# Patient Record
Sex: Female | Born: 1990
Health system: Southern US, Community
[De-identification: ages and names within clinical notes are randomized; demographics above are authoritative.]

## PROBLEM LIST (undated history)

## (undated) DIAGNOSIS — T7840XA Allergy, unspecified, initial encounter: Secondary | ICD-10-CM

## (undated) DIAGNOSIS — Z8619 Personal history of other infectious and parasitic diseases: Secondary | ICD-10-CM

## (undated) DIAGNOSIS — N926 Irregular menstruation, unspecified: Secondary | ICD-10-CM

## (undated) DIAGNOSIS — J45909 Unspecified asthma, uncomplicated: Secondary | ICD-10-CM

## (undated) HISTORY — PX: NO PAST SURGERIES: SHX2092

## (undated) HISTORY — DX: Allergy, unspecified, initial encounter: T78.40XA

## (undated) HISTORY — DX: Personal history of other infectious and parasitic diseases: Z86.19

## (undated) HISTORY — DX: Irregular menstruation, unspecified: N92.6

---

## 2013-04-18 ENCOUNTER — Emergency Department (HOSPITAL_COMMUNITY)
Admission: EM | Admit: 2013-04-18 | Discharge: 2013-04-18 | Disposition: A | Discharge status: Home / Self Care | Payer: Self-pay | Attending: Emergency Medicine | Admitting: Emergency Medicine

## 2013-04-18 ENCOUNTER — Encounter (HOSPITAL_COMMUNITY): Payer: Self-pay | Admitting: Emergency Medicine

## 2013-04-18 DIAGNOSIS — N938 Other specified abnormal uterine and vaginal bleeding: Secondary | ICD-10-CM

## 2013-04-18 DIAGNOSIS — N949 Unspecified condition associated with female genital organs and menstrual cycle: Secondary | ICD-10-CM | POA: Insufficient documentation

## 2013-04-18 DIAGNOSIS — J45909 Unspecified asthma, uncomplicated: Secondary | ICD-10-CM | POA: Insufficient documentation

## 2013-04-18 DIAGNOSIS — N925 Other specified irregular menstruation: Secondary | ICD-10-CM | POA: Insufficient documentation

## 2013-04-18 DIAGNOSIS — Z3202 Encounter for pregnancy test, result negative: Secondary | ICD-10-CM | POA: Insufficient documentation

## 2013-04-18 HISTORY — DX: Unspecified asthma, uncomplicated: J45.909

## 2013-04-18 LAB — URINE MICROSCOPIC-ADD ON

## 2013-04-18 LAB — PREGNANCY, URINE: Preg Test, Ur: NEGATIVE

## 2013-04-18 LAB — WET PREP, GENITAL
Clue Cells Wet Prep HPF POC: NONE SEEN
Trich, Wet Prep: NONE SEEN
Yeast Wet Prep HPF POC: NONE SEEN

## 2013-04-18 LAB — URINALYSIS, ROUTINE W REFLEX MICROSCOPIC
Bilirubin Urine: NEGATIVE
Glucose, UA: NEGATIVE mg/dL
Protein, ur: 30 mg/dL — AB
Urobilinogen, UA: 1 mg/dL (ref 0.0–1.0)

## 2013-04-18 MED ORDER — HYDROMORPHONE HCL PF 1 MG/ML IJ SOLN
1.0000 mg | Freq: Once | INTRAMUSCULAR | Status: DC
  Filled 2013-04-18: qty 1

## 2013-04-18 NOTE — ED Notes (Signed)
Patient reports she has been on her period since Oct 25th sometimes with light bleeding and sometimes heavy with clots.  Also she is reporting left sided lower abdominal pain and back pain and burning with urination.  Reports slight amount of nausea but no vomiting.

## 2013-04-18 NOTE — ED Provider Notes (Signed)
CSN: [id number]     Arrival date & time 04/18/13  1601 History   First MD Initiated Contact with Patient 04/18/13 1609     Chief Complaint  Patient presents with  . Abdominal Pain   (Consider location/radiation/quality/duration/timing/severity/associated sxs/prior Treatment) HPI Pt presenting with concern for vaginal bleeding and abdominal pain.  Pt states she has been having bleeding over the past 2 months-heavier at the time of her normal menses, then spotting in between.  This morning she felt burning with urination.  No fever.  No increased frequency or urgency.  No weakness, dizziness upon standing or fainting.  Has seen some clots of blood earlier in the course but not currently.  Pt states she has never been sexually active. Abdominal pain is in left mid abdomen- this started earlier today.  She has not tried anything for her symptoms.  There are no other associated systemic symptoms, there are no other alleviating or modifying factors.   Past Medical History  Diagnosis Date  . Asthma    History reviewed. No pertinent past surgical history. No family history on file. History  Substance Use Topics  . Smoking status: Never Smoker   . Smokeless tobacco: Not on file  . Alcohol Use: Not on file   OB History   Grav Para Term Preterm Abortions TAB SAB Ect Mult Living                 Review of Systems ROS reviewed and all otherwise negative except for mentioned in HPI  Allergies  Review of patient's allergies indicates no known allergies.  Home Medications   Current Outpatient Rx  Name  Route  Sig  Dispense  Refill  . Ibuprofen (MIDOL) 200 MG CAPS   Oral   Take 2 capsules by mouth as needed (menstrual cramps.).         Marland Kitchen OVER THE COUNTER MEDICATION   Oral   Take 2 capsules by mouth 2 (two) times daily. Lipozene dietary supplement.          BP 121/56  Pulse 81  Temp(Src) 98.3 F (36.8 C) (Oral)  Resp 20  SpO2 100% Vitals reviewed Physical Exam Physical  Examination: General appearance - alert, well appearing, and in no distress Mental status - alert, oriented to person, place, and time Eyes - no scleral icterus, no conjunctival injection Chest - clear to auscultation, no wheezes, rales or rhonchi, symmetric air entry Heart - normal rate, regular rhythm, normal S1, S2, no murmurs, rubs, clicks or gallops Abdomen - soft, nontender, nondistended, no masses or organomegaly Pelvic - normal external genitalia, vulva, vagina, not able to tolerate speculum exam, mild amount of vaginal bleeding present, no adnexal or uterine tenderness, no CMT Extremities - peripheral pulses normal, no pedal edema, no clubbing or cyanosis Skin - normal coloration and turgor, no rashes  ED Course  Procedures (including critical care time) Labs Review Labs Reviewed  WET PREP, GENITAL - Abnormal; Notable for the following:    WBC, Wet Prep HPF POC RARE (*)    All other components within normal limits  URINALYSIS, ROUTINE W REFLEX MICROSCOPIC - Abnormal; Notable for the following:    Color, Urine RED (*)    APPearance CLOUDY (*)    Hgb urine dipstick LARGE (*)    Protein, ur 30 (*)    Leukocytes, UA MODERATE (*)    All other components within normal limits  URINE MICROSCOPIC-ADD ON - Abnormal; Notable for the following:    Bacteria, UA FEW (*)  All other components within normal limits  GC/CHLAMYDIA PROBE AMP  URINE CULTURE  PREGNANCY, URINE   Imaging Review No results found.  EKG Interpretation   None       MDM   1. Dysfunctional uterine bleeding    Pt presenting with c/o vaginal bleeding over past 2 months.  Pt is not tachycardic, no symptoms of anemia.  Abdomen is midly tender but to the left of the umbilicus- no tenderness on pelvic exam.  Doubt serious intraabdominal pathology.  Urine is not c/w UTI- urine culture sent.  D/w patietn f/u with gynecology.  Discharged with strict return precautions.  Pt agreeable with plan.    Ethelda Chick,  MD 04/18/13 1900

## 2013-04-18 NOTE — ED Notes (Signed)
Patient refused pain medication, saying she is not in pain at this time.

## 2013-04-18 NOTE — ED Notes (Signed)
Patient in bathroom getting urine sample 

## 2013-04-20 LAB — URINE CULTURE: Colony Count: 40000

## 2013-04-21 LAB — GC/CHLAMYDIA PROBE AMP: GC Probe RNA: NEGATIVE

## 2013-04-24 NOTE — ED Notes (Signed)
Chart returned from EDP office .With orders written by Johnnette Gourd for  Azithromycin 1 gram po x 1 dose

## 2013-04-25 ENCOUNTER — Telehealth (HOSPITAL_COMMUNITY): Payer: Self-pay | Admitting: *Deleted

## 2013-04-25 NOTE — ED Notes (Signed)
Rx for Azithromycin 1 gram po x 1 dose needs to be called to pharmacy. Per Johnnette Gourd

## 2013-11-28 ENCOUNTER — Encounter (HOSPITAL_COMMUNITY): Payer: Self-pay | Admitting: *Deleted

## 2013-11-28 ENCOUNTER — Inpatient Hospital Stay (HOSPITAL_COMMUNITY)
Admission: AD | Admit: 2013-11-28 | Discharge: 2013-11-28 | Disposition: A | Discharge status: Home / Self Care | Payer: BC Managed Care – PPO | Source: Ambulatory Visit | Attending: Obstetrics & Gynecology | Admitting: Obstetrics & Gynecology

## 2013-11-28 DIAGNOSIS — G8929 Other chronic pain: Secondary | ICD-10-CM

## 2013-11-28 DIAGNOSIS — N3090 Cystitis, unspecified without hematuria: Secondary | ICD-10-CM | POA: Insufficient documentation

## 2013-11-28 DIAGNOSIS — N3000 Acute cystitis without hematuria: Secondary | ICD-10-CM

## 2013-11-28 DIAGNOSIS — M549 Dorsalgia, unspecified: Secondary | ICD-10-CM | POA: Insufficient documentation

## 2013-11-28 LAB — URINE MICROSCOPIC-ADD ON

## 2013-11-28 LAB — URINALYSIS, ROUTINE W REFLEX MICROSCOPIC
Bilirubin Urine: NEGATIVE
GLUCOSE, UA: NEGATIVE mg/dL
KETONES UR: NEGATIVE mg/dL
Nitrite: NEGATIVE
Protein, ur: 30 mg/dL — AB
Specific Gravity, Urine: 1.025 (ref 1.005–1.030)
Urobilinogen, UA: 0.2 mg/dL (ref 0.0–1.0)
pH: 5.5 (ref 5.0–8.0)

## 2013-11-28 LAB — WET PREP, GENITAL
Clue Cells Wet Prep HPF POC: NONE SEEN
TRICH WET PREP: NONE SEEN
YEAST WET PREP: NONE SEEN

## 2013-11-28 LAB — POCT PREGNANCY, URINE: PREG TEST UR: NEGATIVE

## 2013-11-28 MED ORDER — PHENAZOPYRIDINE HCL 200 MG PO TABS: 200.0000 mg | ORAL_TABLET | Freq: Three times a day (TID) | ORAL | Status: DC

## 2013-11-28 MED ORDER — CIPROFLOXACIN HCL 250 MG PO TABS: 250.0000 mg | ORAL_TABLET | Freq: Two times a day (BID) | ORAL | Status: DC

## 2013-11-28 NOTE — MAU Provider Note (Signed)
History     CSN: [id number]  Arrival date and time: 11/28/13 1444   First Provider Initiated Contact with Patient 11/28/13 1533      Chief Complaint  Patient presents with  . Back Pain  . Dysuria   The history is provided by the patient.   Ms. Brooke Russell is a 23 y/o G0P0 with a PMH of recurrent UTI's and + chlamydia screen who presents with moderate chronic low back pain off and on as well as frequency, urgency, hesitancy and burning with urination. LMP 11/26/13.   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 Initially the pain resolved with acetaminophen but retuned with increased intensity two days later and is now constant and sharp to achy. Pain is decreased with standing and lying prone with a pillow under her belly. Pain is also decreased by pulling her knees towards her chest while seated.  Pain increases with lying flat, forceful low back extension and walking short distances. Patient indicates that low back pain is diffuse and occurs on either side of her spine and is not associated with radiating pain, bowel or bladder dysfunction, and no tingling, numbness, weakness in the distal extremities.   Urinary symptoms began this morning and pain only happens during urination. Patient denies associated symptoms of fever, chills, N/V, discharge or blood in urine. Patient denies any Hx of vaginal intercourse and is not currently sexually active.   OB History   Grav Para Term Preterm Abortions TAB SAB Ect Mult Living   0               Past Medical History  Diagnosis Date  . Asthma     Past Surgical History  Procedure Laterality Date  . No past surgeries      No family history on file.  History  Substance Use Topics  . Smoking status: Never Smoker   . Smokeless tobacco: Not on file  . Alcohol Use: Yes     Comment: very rare    Allergies: No Known Allergies  Prescriptions prior to admission  Medication Sig Dispense  Refill  . albuterol (PROVENTIL HFA;VENTOLIN HFA) 108 (90 BASE) MCG/ACT inhaler Inhale 2 puffs into the lungs every 6 (six) hours as needed for wheezing or shortness of breath.      . Cyanocobalamin (B-12 PO) Take 1 tablet by mouth daily.      . Fluocinolone Acetonide (DERMA-SMOOTHE/FS BODY) 0.01 % OIL Apply 1 application topically daily.      Marland Kitchen. OVER THE COUNTER MEDICATION Take 2 capsules by mouth daily. Lipozene dietary supplement.        Review of Systems  Constitutional: Negative for fever and malaise/fatigue.  Gastrointestinal: Negative for nausea, vomiting, abdominal pain, diarrhea and constipation.  Genitourinary: Positive for dysuria, urgency and frequency. Negative for hematuria and flank pain.       Neg - vaginal bleeding, discharge  Musculoskeletal: Positive for back pain.   Physical Exam   Blood pressure 123/66, pulse 91, temperature 98.9 F (37.2 C), temperature source Oral, resp. rate 18, height 5\' 5"  (1.651 m), weight 356 lb (161.481 kg), last menstrual period 11/25/2013.  Physical Exam  Constitutional: She appears well-developed and well-nourished.  BMI 59.2  HENT:  Head: Normocephalic and atraumatic.  Eyes: Conjunctivae are normal. Right eye exhibits no discharge. Left eye exhibits no discharge.  Cardiovascular: Normal rate and regular rhythm.   Murmur heard. Grade I-II/VI systolic ejection murmur heard best over  L upper sternal border 2nd intercostal space  Respiratory: Effort normal and breath sounds normal.  GI: Soft. Bowel sounds are normal. She exhibits no distension and no mass. There is no tenderness. There is no guarding.  Genitourinary: No vaginal discharge found.  Speculum Exam Deferred. Wet prep and GC/chalmydia obtained with blind swab. Patient declined speculum exam. States that she is a virgin  Musculoskeletal:  Negative straight leg test R and L. Pain decreased with hips and knees flexed to chest bilaterally. Patient acutely tender to palpation over L  and R sacroiliac jts. And lumbar paraspinals.  Neurological: She is alert.  L1 - S1 dermatomes equal and intact bilaterally. L1 - S1 myotomes 5/5 equal and intact bilaterally.   Skin: Skin is warm and dry. No erythema.  Psychiatric: She has a normal mood and affect. Her behavior is normal.   Results for orders placed during the hospital encounter of 11/28/13 (from the past 24 hour(s))  URINALYSIS, ROUTINE W REFLEX MICROSCOPIC     Status: Abnormal   Collection Time    11/28/13  3:10 PM      Result Value Ref Range   Color, Urine YELLOW  YELLOW   APPearance HAZY (*) CLEAR   Specific Gravity, Urine 1.025  1.005 - 1.030   pH 5.5  5.0 - 8.0   Glucose, UA NEGATIVE  NEGATIVE mg/dL   Hgb urine dipstick LARGE (*) NEGATIVE   Bilirubin Urine NEGATIVE  NEGATIVE   Ketones, ur NEGATIVE  NEGATIVE mg/dL   Protein, ur 30 (*) NEGATIVE mg/dL   Urobilinogen, UA 0.2  0.0 - 1.0 mg/dL   Nitrite NEGATIVE  NEGATIVE   Leukocytes, UA TRACE (*) NEGATIVE  URINE MICROSCOPIC-ADD ON     Status: Abnormal   Collection Time    11/28/13  3:10 PM      Result Value Ref Range   Squamous Epithelial / LPF MANY (*) RARE   WBC, UA 3-6  <3 WBC/hpf   RBC / HPF TOO NUMEROUS TO COUNT  <3 RBC/hpf  POCT PREGNANCY, URINE     Status: None   Collection Time    11/28/13  3:16 PM      Result Value Ref Range   Preg Test, Ur NEGATIVE  NEGATIVE  WET PREP, GENITAL     Status: Abnormal   Collection Time    11/28/13  4:36 PM      Result Value Ref Range   Yeast Wet Prep HPF POC NONE SEEN  NONE SEEN   Trich, Wet Prep NONE SEEN  NONE SEEN   Clue Cells Wet Prep HPF POC NONE SEEN  NONE SEEN   WBC, Wet Prep HPF POC FEW (*) NONE SEEN     MAU Course  Procedures None  MDM UPT - negative UA, wet prep and GC/Chlamydia today Urine culture ordered  Assessment and Plan  A: Cystitis secondary to UTI, UA contaminated by onset of menses but with 3-6 Leukocytes per/HPF.      Lumbar paraspinal strain.   P: UTI symptoms treated  presumptively with cipro and pyridium. Urine culture ordered. Follow up with PCP or orthopedics for treatment of low back pain. Discussed use of NSAIDS vs acetaminophen for treatment of symptoms.  Vara Guardian PA-S   Urine culture pending GC/Chlamydia pending  I have seen and evaluated the patient with the NP/PA/Med student. I agree with the assessment and plan as written above.   Freddi Starr, PA-C  11/28/2013 6:05 PM

## 2013-11-28 NOTE — MAU Provider Note (Signed)
Attestation of Attending Supervision of Advanced Practitioner (PA/CNM/NP): Evaluation and management procedures were performed by the Advanced Practitioner under my supervision and collaboration.  I have reviewed the Advanced Practitioner's note and chart, and I agree with the management and plan.  Brenda Samano, MD, FACOG Attending Obstetrician & Gynecologist Faculty Practice, [facility]   

## 2013-11-28 NOTE — Discharge Instructions (Signed)

## 2013-11-28 NOTE — MAU Note (Addendum)
Low back pain past 3 days, hx of bad UTI's.  Having burning with urination, started today. Periods have been irreg in frequency and duration.  Neg cva tenderness

## 2013-11-29 LAB — URINE CULTURE: Colony Count: 25000

## 2013-11-29 LAB — GC/CHLAMYDIA PROBE AMP
CT PROBE, AMP APTIMA: NEGATIVE
GC Probe RNA: NEGATIVE

## 2013-12-09 ENCOUNTER — Inpatient Hospital Stay (HOSPITAL_COMMUNITY)
Admission: AD | Admit: 2013-12-09 | Discharge: 2013-12-09 | Disposition: A | Discharge status: Home / Self Care | Payer: BC Managed Care – PPO | Source: Ambulatory Visit | Attending: Obstetrics & Gynecology | Admitting: Obstetrics & Gynecology

## 2013-12-09 ENCOUNTER — Encounter (HOSPITAL_COMMUNITY): Payer: Self-pay | Admitting: *Deleted

## 2013-12-09 DIAGNOSIS — N938 Other specified abnormal uterine and vaginal bleeding: Secondary | ICD-10-CM | POA: Insufficient documentation

## 2013-12-09 DIAGNOSIS — N926 Irregular menstruation, unspecified: Secondary | ICD-10-CM

## 2013-12-09 DIAGNOSIS — N949 Unspecified condition associated with female genital organs and menstrual cycle: Secondary | ICD-10-CM | POA: Insufficient documentation

## 2013-12-09 DIAGNOSIS — N939 Abnormal uterine and vaginal bleeding, unspecified: Secondary | ICD-10-CM

## 2013-12-09 DIAGNOSIS — N925 Other specified irregular menstruation: Secondary | ICD-10-CM | POA: Insufficient documentation

## 2013-12-09 LAB — CBC WITH DIFFERENTIAL/PLATELET
BASOS PCT: 0 % (ref 0–1)
Basophils Absolute: 0 10*3/uL (ref 0.0–0.1)
EOS ABS: 0.2 10*3/uL (ref 0.0–0.7)
Eosinophils Relative: 3 % (ref 0–5)
HCT: 33 % — ABNORMAL LOW (ref 36.0–46.0)
Hemoglobin: 10.6 g/dL — ABNORMAL LOW (ref 12.0–15.0)
Lymphocytes Relative: 39 % (ref 12–46)
Lymphs Abs: 2.9 10*3/uL (ref 0.7–4.0)
MCH: 26.7 pg (ref 26.0–34.0)
MCHC: 32.1 g/dL (ref 30.0–36.0)
MCV: 83.1 fL (ref 78.0–100.0)
Monocytes Absolute: 0.5 10*3/uL (ref 0.1–1.0)
Monocytes Relative: 7 % (ref 3–12)
NEUTROS PCT: 51 % (ref 43–77)
Neutro Abs: 3.9 10*3/uL (ref 1.7–7.7)
PLATELETS: 278 10*3/uL (ref 150–400)
RBC: 3.97 MIL/uL (ref 3.87–5.11)
RDW: 14.5 % (ref 11.5–15.5)
WBC: 7.5 10*3/uL (ref 4.0–10.5)

## 2013-12-09 MED ORDER — NORGESTIMATE-ETH ESTRADIOL 0.25-35 MG-MCG PO TABS: ORAL_TABLET | ORAL | Status: DC

## 2013-12-09 NOTE — MAU Provider Note (Signed)
  History     CSN: [id number]  Arrival date and time: 12/09/13 1601   First Provider Initiated Contact with Patient 12/09/13 1654      Chief Complaint  Patient presents with  . Vaginal Bleeding   HPIpt is not pregnant and presents with prolonged vaginal bleeding since 11/26/2013.  Pt started bleeding Bright red changing a pad every 2 hours.  Pt was seen on 11/28/2013 with UTI symptoms and treated with Cipro and Pyridium. Pt's sx have resolved from that but pt has continued to bleed.  Pt is morbidly obese and has gained weight over past 8 months. Menarche age? 13 with RCM until this episode.  Pt is not sexually active.  Registered Nurse Signed MAU Note Service date: 12/09/2013 4:21 PM   T. States that she has been bleeding since the 10th which was her period. Pt. States that menstrual period lasts about 4 days.       Past Medical History  Diagnosis Date  . Asthma     Past Surgical History  Procedure Laterality Date  . No past surgeries      History reviewed. No pertinent family history.  History  Substance Use Topics  . Smoking status: Never Smoker   . Smokeless tobacco: Never Used  . Alcohol Use: No     Comment: very rare    Allergies: No Known Allergies  Prescriptions prior to admission  Medication Sig Dispense Refill  . Cyanocobalamin (B-12 PO) Take 5,000 mg by mouth daily.       . Fluocinolone Acetonide (DERMA-SMOOTHE/FS BODY) 0.01 % OIL Apply 1 application topically daily.      Marland Kitchen. albuterol (PROVENTIL HFA;VENTOLIN HFA) 108 (90 BASE) MCG/ACT inhaler Inhale 2 puffs into the lungs every 6 (six) hours as needed for wheezing or shortness of breath.        Review of Systems  Constitutional: Negative for fever and chills.  Gastrointestinal: Negative for nausea, vomiting, abdominal pain, diarrhea and constipation.  Genitourinary: Negative for dysuria and urgency.   Physical Exam   Blood pressure 133/73, pulse 87, temperature 98.6 F (37 C), temperature source  Oral, resp. rate 20, last menstrual period 11/25/2013.  Physical Exam  Nursing note and vitals reviewed. Constitutional: She is oriented to person, place, and time. She appears well-developed and well-nourished. No distress.  HENT:  Head: Normocephalic.  Eyes: Pupils are equal, round, and reactive to light.  Neck: Normal range of motion. Neck supple.  Cardiovascular: Normal rate.   Respiratory: Effort normal.  GI: Soft. She exhibits no distension. There is no tenderness. There is no rebound.  Genitourinary:  Deferred- not sexually active  Musculoskeletal: Normal range of motion.  Neurological: She is alert and oriented to person, place, and time.  Skin: Skin is warm and dry.  Psychiatric: She has a normal mood and affect.    MAU Course  Procedures Discussed at length about prolonged bleeding and also possible PCOS Option of ultrasound discussed- will defer and have clinic follow up Will prescribe OCs 2 daily for 5 days and then one daily until appointment    Assessment and Plan  Abnormal uterine bleeding Rx Sprintec 21 day pack 2 tablets daily for 5 days and then one daily until appointment [facility]  [facility],Demontray Franta 12/09/2013, 4:56 PM

## 2013-12-09 NOTE — MAU Note (Signed)
T. States that she has been bleeding since the 10th which was her period. Pt. States that menstrual period lasts about 4 days.

## 2013-12-09 NOTE — Discharge Instructions (Signed)
Abnormal Uterine Bleeding Abnormal uterine bleeding can affect women at various stages in life, including teenagers, women in their reproductive years, pregnant women, and women who have reached menopause. Several kinds of uterine bleeding are considered abnormal, including:  Bleeding or spotting between periods.   Bleeding after sexual intercourse.   Bleeding that is heavier or more than normal.   Periods that last longer than usual.  Bleeding after menopause.  Many cases of abnormal uterine bleeding are minor and simple to treat, while others are more serious. Any type of abnormal bleeding should be evaluated by your health care provider. Treatment will depend on the cause of the bleeding. HOME CARE INSTRUCTIONS Monitor your condition for any changes. The following actions may help to alleviate any discomfort you are experiencing:  Avoid the use of tampons and douches as directed by your health care provider.  Change your pads frequently. You should get regular pelvic exams and Pap tests. Keep all follow-up appointments for diagnostic tests as directed by your health care provider.  SEEK MEDICAL CARE IF:   Your bleeding lasts more than 1 week.   You feel dizzy at times.  SEEK IMMEDIATE MEDICAL CARE IF:   You pass out.   You are changing pads every 15 to 30 minutes.   You have abdominal pain.  You have a fever.   You become sweaty or weak.   You are passing large blood clots from the vagina.   You start to feel nauseous and vomit. MAKE SURE YOU:   Understand these instructions.  Will watch your condition.  Will get help right away if you are not doing well or get worse. Document Released: 05/08/2005 Document Revised: 05/13/2013 Document Reviewed: 12/05/2012 Exit[employer] Patient Information 2015 [employer]. This information is not intended to replace advice given to you by your health care provider. Make sure you discuss any questions you have with your  health care provider.  

## 2013-12-09 NOTE — MAU Note (Signed)
Still bleeding. Really heavy.

## 2013-12-10 NOTE — MAU Provider Note (Signed)
Attestation of Attending Supervision of Advanced Practitioner (PA/CNM/NP): Evaluation and management procedures were performed by the Advanced Practitioner under my supervision and collaboration.  I have reviewed the Advanced Practitioner's note and chart, and I agree with the management and plan.  Maureen Delatte, MD, FACOG Attending Obstetrician & Gynecologist Faculty Practice, [facility]   

## 2014-12-23 ENCOUNTER — Encounter (HOSPITAL_COMMUNITY): Payer: Self-pay | Admitting: *Deleted

## 2014-12-23 ENCOUNTER — Inpatient Hospital Stay (HOSPITAL_COMMUNITY)
Admission: AD | Admit: 2014-12-23 | Discharge: 2014-12-23 | Disposition: A | Discharge status: Home / Self Care | Payer: Self-pay | Source: Ambulatory Visit | Attending: Obstetrics and Gynecology | Admitting: Obstetrics and Gynecology

## 2014-12-23 DIAGNOSIS — N898 Other specified noninflammatory disorders of vagina: Secondary | ICD-10-CM | POA: Insufficient documentation

## 2014-12-23 DIAGNOSIS — R300 Dysuria: Secondary | ICD-10-CM | POA: Insufficient documentation

## 2014-12-23 LAB — URINALYSIS, ROUTINE W REFLEX MICROSCOPIC
BILIRUBIN URINE: NEGATIVE
Glucose, UA: NEGATIVE mg/dL
Hgb urine dipstick: NEGATIVE
Ketones, ur: NEGATIVE mg/dL
Nitrite: NEGATIVE
Protein, ur: NEGATIVE mg/dL
Specific Gravity, Urine: 1.025 (ref 1.005–1.030)
Urobilinogen, UA: 0.2 mg/dL (ref 0.0–1.0)
pH: 5.5 (ref 5.0–8.0)

## 2014-12-23 LAB — WET PREP, GENITAL
Clue Cells Wet Prep HPF POC: NONE SEEN
TRICH WET PREP: NONE SEEN
Yeast Wet Prep HPF POC: NONE SEEN

## 2014-12-23 LAB — URINE MICROSCOPIC-ADD ON

## 2014-12-23 LAB — POCT PREGNANCY, URINE: PREG TEST UR: NEGATIVE

## 2014-12-23 MED ORDER — TERCONAZOLE 0.8 % VA CREA: 1.0000 | TOPICAL_CREAM | Freq: Every day | VAGINAL | Status: DC

## 2014-12-23 NOTE — Discharge Instructions (Signed)
Dysuria °Dysuria is the medical term for pain with urination. There are many causes for dysuria, but urinary tract infection is the most common. If a urinalysis was performed it can show that there is a urinary tract infection. A urine culture confirms that you or your child is sick. You will need to follow up with a healthcare provider because: °· If a urine culture was done you will need to know the culture results and treatment recommendations. °· If the urine culture was positive, you or your child will need to be put on antibiotics or know if the antibiotics prescribed are the right antibiotics for your urinary tract infection. °· If the urine culture is negative (no urinary tract infection), then other causes may need to be explored or antibiotics need to be stopped. °Today laboratory work may have been done and there does not seem to be an infection. If cultures were done they will take at least 24 to 48 hours to be completed. °Today x-rays may have been taken and they read as normal. No cause can be found for the problems. The x-rays may be re-read by a radiologist and you will be contacted if additional findings are made. °You or your child may have been put on medications to help with this problem until you can see your primary caregiver. If the problems get better, see your primary caregiver if the problems return. If you were given antibiotics (medications which kill germs), take all of the mediations as directed for the full course of treatment.  °If laboratory work was done, you need to find the results. Leave a telephone number where you can be reached. If this is not possible, make sure you find out how you are to get test results. °HOME CARE INSTRUCTIONS  °· Drink lots of fluids. For adults, drink eight, 8 ounce glasses of clear juice or water a day. For children, replace fluids as suggested by your caregiver. °· Empty the bladder often. Avoid holding urine for long periods of time. °· After a bowel  movement, women should cleanse front to back, using each tissue only once. °· Empty your bladder before and after sexual intercourse. °· Take all the medicine given to you until it is gone. You may feel better in a few days, but TAKE ALL MEDICINE. °· Avoid caffeine, tea, alcohol and carbonated beverages, because they tend to irritate the bladder. °· In men, alcohol may irritate the prostate. °· Only take over-the-counter or prescription medicines for pain, discomfort, or fever as directed by your caregiver. °· If your caregiver has given you a follow-up appointment, it is very important to keep that appointment. Not keeping the appointment could result in a chronic or permanent injury, pain, and disability. If there is any problem keeping the appointment, you must call back to this facility for assistance. °SEEK IMMEDIATE MEDICAL CARE IF:  °· Back pain develops. °· A fever develops. °· There is nausea (feeling sick to your stomach) or vomiting (throwing up). °· Problems are no better with medications or are getting worse. °MAKE SURE YOU:  °· Understand these instructions. °· Will watch your condition. °· Will get help right away if you are not doing well or get worse. °Document Released: 02/04/2004 Document Revised: 07/31/2011 Document Reviewed: 12/12/2007 °ExitCare® Patient Information ©2015 [employer]. This information is not intended to replace advice given to you by your health care provider. Make sure you discuss any questions you have with your health care provider. ° ° °Candida Infection °  A Candida infection (also called yeast, fungus, and Monilia infection) is an overgrowth of yeast that can occur anywhere on the body. A yeast infection commonly occurs in warm, moist body areas. Usually, the infection remains localized but can spread to become a systemic infection. A yeast infection may be a sign of a more severe disease such as diabetes, leukemia, or AIDS. °A yeast infection can occur in both men and  women. In women, Candida vaginitis is a vaginal infection. It is one of the most common causes of vaginitis. Men usually do not have symptoms or know they have an infection until other problems develop. Men may find out they have a yeast infection because their sex partner has a yeast infection. Uncircumcised men are more likely to get a yeast infection than circumcised men. This is because the uncircumcised glans is not exposed to air and does not remain as dry as that of a circumcised glans. Older adults may develop yeast infections around dentures. °CAUSES  °Women °· Antibiotics. °· Steroid medication taken for a long time. °· Being overweight (obese). °· Diabetes. °· Poor immune condition. °· Certain serious medical conditions. °· Immune suppressive medications for organ transplant patients. °· Chemotherapy. °· Pregnancy. °· Menstruation. °· Stress and fatigue. °· Intravenous drug use. °· Oral contraceptives. °· Wearing tight-fitting clothes in the crotch area. °· Catching it from a sex partner who has a yeast infection. °· Spermicide. °· Intravenous, urinary, or other catheters. °Men °· Catching it from a sex partner who has a yeast infection. °· Having oral or anal sex with a person who has the infection. °· Spermicide. °· Diabetes. °· Antibiotics. °· Poor immune system. °· Medications that suppress the immune system. °· Intravenous drug use. °· Intravenous, urinary, or other catheters. °SYMPTOMS  °Women °· Thick, white vaginal discharge. °· Vaginal itching. °· Redness and swelling in and around the vagina. °· Irritation of the lips of the vagina and perineum. °· Blisters on the vaginal lips and perineum. °· Painful sexual intercourse. °· Low blood sugar (hypoglycemia). °· Painful urination. °· Bladder infections. °· Intestinal problems such as constipation, indigestion, bad breath, bloating, increase in gas, diarrhea, or loose stools. °Men °· Men may develop intestinal problems such as constipation,  indigestion, bad breath, bloating, increase in gas, diarrhea, or loose stools. °· Dry, cracked skin on the penis with itching or discomfort. °· Jock itch. °· Dry, flaky skin. °· Athlete's foot. °· Hypoglycemia. °DIAGNOSIS  °Women °· A history and an exam are performed. °· The discharge may be examined under a microscope. °· A culture may be taken of the discharge. °Men °· A history and an exam are performed. °· Any discharge from the penis or areas of cracked skin will be looked at under the microscope and cultured. °· Stool samples may be cultured. °TREATMENT  °Women °· Vaginal antifungal suppositories and creams. °· Medicated creams to decrease irritation and itching on the outside of the vagina. °· Warm compresses to the perineal area to decrease swelling and discomfort. °· Oral antifungal medications. °· Medicated vaginal suppositories or cream for repeated or recurrent infections. °· Wash and dry the irritation areas before applying the cream. °· Eating yogurt with Lactobacillus may help with prevention and treatment. °· Sometimes painting the vagina with gentian violet solution may help if creams and suppositories do not work. °Men °· Antifungal creams and oral antifungal medications. °· Sometimes treatment must continue for 30 days after the symptoms go away to prevent recurrence. °HOME CARE INSTRUCTIONS  °  Women °· Use cotton underwear and avoid tight-fitting clothing. °· Avoid colored, scented toilet paper and deodorant tampons or pads. °· Do not douche. °· Keep your diabetes under control. °· Finish all the prescribed medications. °· Keep your skin clean and dry. °· Consume milk or yogurt with Lactobacillus-active culture regularly. If you get frequent yeast infections and think that is what the infection is, there are over-the-counter medications that you can get. If the infection does not show healing in 3 days, talk to your caregiver. °· Tell your sex partner you have a yeast infection. Your partner may  need treatment also, especially if your infection does not clear up or recurs. °Men °· Keep your skin clean and dry. °· Keep your diabetes under control. °· Finish all prescribed medications. °· Tell your sex partner that you have a yeast infection so he or she can be treated if necessary. °SEEK MEDICAL CARE IF:  °· Your symptoms do not clear up or worsen in one week after treatment. °· You have an oral temperature above 102° F (38.9° C). °· You have trouble swallowing or eating for a prolonged time. °· You develop blisters on and around your vagina. °· You develop vaginal bleeding and it is not your menstrual period. °· You develop abdominal pain. °· You develop intestinal problems as mentioned above. °· You get weak or light-headed. °· You have painful or increased urination. °· You have pain during sexual intercourse. °MAKE SURE YOU:  °· Understand these instructions. °· Will watch your condition. °· Will get help right away if you are not doing well or get worse. °Document Released: 06/15/2004 Document Revised: 09/22/2013 Document Reviewed: 09/27/2009 °ExitCare® Patient Information ©2015 [employer]. This information is not intended to replace advice given to you by your health care provider. Make sure you discuss any questions you have with your health care provider. ° °

## 2014-12-23 NOTE — MAU Note (Signed)
Desires pregnancy test; desires STD check; c/o lower back pain and burning with urination; uses BC pill;

## 2014-12-23 NOTE — MAU Provider Note (Signed)
History     CSN: [id number]  Arrival date and time: 12/23/14 1721   None     Chief Complaint  Patient presents with  . Dysuria   HPI Brooke Russell is 24 y.o. G0P0 Unknown weeks presenting with frequency, dysuria since 2 days with low back pain.  Would like pregnancy test and STD screening tonight.  Is using OCPs for contraception. 1 Sexually partner X 6 years,  Engaged.  Last intercourse a "long time ago" b/c her boyfriend is deployed and will be home end of the week.  Would like sxs to be resolved.    Past Medical History  Diagnosis Date  . Asthma     Past Surgical History  Procedure Laterality Date  . No past surgeries      Family History  Problem Relation Age of Onset  . Alcohol abuse Neg Hx   . Arthritis Neg Hx   . Asthma Neg Hx   . Birth defects Neg Hx   . Cancer Neg Hx   . COPD Neg Hx   . Depression Neg Hx   . Diabetes Neg Hx   . Drug abuse Neg Hx   . Early death Neg Hx   . Hearing loss Neg Hx   . Heart disease Neg Hx   . Hyperlipidemia Neg Hx   . Hypertension Neg Hx   . Kidney disease Neg Hx   . Learning disabilities Neg Hx   . Mental illness Neg Hx   . Mental retardation Neg Hx   . Miscarriages / Stillbirths Neg Hx   . Stroke Neg Hx   . Vision loss Neg Hx   . Varicose Veins Neg Hx     History  Substance Use Topics  . Smoking status: Never Smoker   . Smokeless tobacco: Never Used  . Alcohol Use: No     Comment: very rare    Allergies: No Known Allergies  Prescriptions prior to admission  Medication Sig Dispense Refill Last Dose  . albuterol (PROVENTIL HFA;VENTOLIN HFA) 108 (90 BASE) MCG/ACT inhaler Inhale 2 puffs into the lungs every 6 (six) hours as needed for wheezing or shortness of breath.   rescue  . Fluocinolone Acetonide (DERMA-SMOOTHE/FS BODY) 0.01 % OIL Apply 1 application topically daily.   12/09/2013 at Unknown time  . norgestimate-ethinyl estradiol (ORTHO-CYCLEN,SPRINTEC,PREVIFEM) 0.25-35 MG-MCG tablet 2 tablets daily for 5 days then  1 daily until appointment 1 Package 11     Review of Systems  Constitutional: Negative for fever and chills.  Gastrointestinal: Negative for nausea, vomiting and abdominal pain.  Genitourinary: Positive for dysuria, urgency and frequency.       Neg for vaginal bleeding or abnormal discharge.   Musculoskeletal: Positive for back pain (low back pain).  Neurological: Negative for headaches.   Physical Exam   Blood pressure 126/72, pulse 76, temperature 98.4 F (36.9 C), temperature source Oral, resp. rate 18, height 5' 7.5" (1.715 m), weight 353 lb 6.4 oz (160.301 kg), last menstrual period 11/20/2014.  Physical Exam  Constitutional: She appears well-developed and well-nourished. No distress.  HENT:  Head: Normocephalic.  Neck: Normal range of motion.  Cardiovascular: Normal rate.   Respiratory: Effort normal.  GI: Soft. She exhibits no distension and no mass. There is no tenderness. There is no rebound, no guarding and no CVA tenderness.  Genitourinary: There is no rash, tenderness or lesion on the right labia. There is no rash, tenderness or lesion on the left labia. Uterus is not enlarged and not tender.  Cervix exhibits no motion tenderness, no discharge and no friability. Right adnexum displays no mass, no tenderness and no fullness. Left adnexum displays no mass, no tenderness and no fullness. There is erythema (slightly red without lesion, ulcerations) and tenderness (mild) in the vagina. No bleeding in the vagina. Vaginal discharge (small amount of white discharge) found.  Skin: Skin is warm and dry.  Psychiatric: She has a normal mood and affect. Her behavior is normal. Thought content normal.      Results for orders placed or performed during the hospital encounter of 12/23/14 (from the past 24 hour(s))  Urinalysis, Routine w reflex microscopic (not at [facility])     Status: Abnormal   Collection Time: 12/23/14  6:00 PM  Result Value Ref Range   Color, Urine YELLOW YELLOW    APPearance CLEAR CLEAR   Specific Gravity, Urine 1.025 1.005 - 1.030   pH 5.5 5.0 - 8.0   Glucose, UA NEGATIVE NEGATIVE mg/dL   Hgb urine dipstick NEGATIVE NEGATIVE   Bilirubin Urine NEGATIVE NEGATIVE   Ketones, ur NEGATIVE NEGATIVE mg/dL   Protein, ur NEGATIVE NEGATIVE mg/dL   Urobilinogen, UA 0.2 0.0 - 1.0 mg/dL   Nitrite NEGATIVE NEGATIVE   Leukocytes, UA SMALL (A) NEGATIVE  Urine microscopic-add on     Status: Abnormal   Collection Time: 12/23/14  6:00 PM  Result Value Ref Range   Squamous Epithelial / LPF MANY (A) RARE   WBC, UA 3-6 <3 WBC/hpf   RBC / HPF 0-2 <3 RBC/hpf   Bacteria, UA MANY (A) RARE  Pregnancy, urine POC     Status: None   Collection Time: 12/23/14  6:16 PM  Result Value Ref Range   Preg Test, Ur NEGATIVE NEGATIVE  Wet prep, genital     Status: Abnormal   Collection Time: 12/23/14  7:20 PM  Result Value Ref Range   Yeast Wet Prep HPF POC NONE SEEN NONE SEEN   Trich, Wet Prep NONE SEEN NONE SEEN   Clue Cells Wet Prep HPF POC NONE SEEN NONE SEEN   WBC, Wet Prep HPF POC FEW (A) NONE SEEN   MAU Course  Procedures  Urine culture to lab  MDM MSE Exam Labs Discussed labs and findings with the patient.  Will treat with Terazol 3 vaginal creme.  Assessment and Plan  A:  Vaginal irritation     Dysuria   P:  Terazol 3 vaginal creme at hs X 3 nights      Increase po fluids     Urine cultures will be reported if positive       F/U with MD of her choice if sxs persist.  KEY,EVE M 12/23/2014, 7:40 PM

## 2014-12-23 NOTE — MAU Note (Signed)
Burns when she urinates, started 2 days ago. Having frequency and urgency with urination.

## 2014-12-24 LAB — GC/CHLAMYDIA PROBE AMP (~~LOC~~) NOT AT ARMC
Chlamydia: POSITIVE — AB
Neisseria Gonorrhea: NEGATIVE

## 2014-12-24 LAB — HIV ANTIBODY (ROUTINE TESTING W REFLEX): HIV Screen 4th Generation wRfx: NONREACTIVE

## 2014-12-25 ENCOUNTER — Telehealth (HOSPITAL_COMMUNITY): Payer: Self-pay | Admitting: *Deleted

## 2014-12-25 DIAGNOSIS — A749 Chlamydial infection, unspecified: Secondary | ICD-10-CM

## 2014-12-25 LAB — CULTURE, OB URINE
Culture: >=100000
SPECIAL REQUESTS: NORMAL

## 2014-12-25 MED ORDER — AZITHROMYCIN 500 MG PO TABS: ORAL_TABLET | ORAL | Status: DC

## 2014-12-25 NOTE — Telephone Encounter (Signed)
Telephone call to patient regarding positive chlamydia culture, patient notified.  Rx routed to patient's pharmacy per protocol.  Instructed patient to complete treatment and to notify her partner for treatment.  Instructed patient to abstain from sex for 7 days post treatment.  Report faxed to health department.

## 2016-06-16 ENCOUNTER — Ambulatory Visit (INDEPENDENT_AMBULATORY_CARE_PROVIDER_SITE_OTHER): Payer: BLUE CROSS/BLUE SHIELD | Admitting: Medical

## 2016-06-16 ENCOUNTER — Encounter: Payer: Self-pay | Admitting: Medical

## 2016-06-16 ENCOUNTER — Ambulatory Visit: Payer: Self-pay | Admitting: Family

## 2016-06-16 VITALS — BP 121/98 | HR 90 | Temp 98.0°F | Resp 16 | Ht 67.5 in | Wt 357.1 lb

## 2016-06-16 DIAGNOSIS — R822 Biliuria: Secondary | ICD-10-CM | POA: Diagnosis not present

## 2016-06-16 DIAGNOSIS — Z0100 Encounter for examination of eyes and vision without abnormal findings: Secondary | ICD-10-CM

## 2016-06-16 DIAGNOSIS — R112 Nausea with vomiting, unspecified: Secondary | ICD-10-CM | POA: Diagnosis not present

## 2016-06-16 DIAGNOSIS — E669 Obesity, unspecified: Secondary | ICD-10-CM | POA: Diagnosis not present

## 2016-06-16 DIAGNOSIS — Z973 Presence of spectacles and contact lenses: Secondary | ICD-10-CM

## 2016-06-16 DIAGNOSIS — N926 Irregular menstruation, unspecified: Secondary | ICD-10-CM

## 2016-06-16 LAB — CBC WITH DIFFERENTIAL/PLATELET
BASOS ABS: 0 {cells}/uL (ref 0–200)
Basophils Relative: 0 %
Eosinophils Absolute: 0 cells/uL — ABNORMAL LOW (ref 15–500)
Eosinophils Relative: 0 %
HCT: 36.2 % (ref 35.0–45.0)
HEMOGLOBIN: 11.4 g/dL — AB (ref 11.7–15.5)
LYMPHS ABS: 1426 {cells}/uL (ref 850–3900)
Lymphocytes Relative: 31 %
MCH: 26.4 pg — AB (ref 27.0–33.0)
MCHC: 31.5 g/dL — ABNORMAL LOW (ref 32.0–36.0)
MCV: 83.8 fL (ref 80.0–100.0)
MONOS PCT: 7 %
MPV: 9.5 fL (ref 7.5–12.5)
Monocytes Absolute: 322 cells/uL (ref 200–950)
Neutro Abs: 2852 cells/uL (ref 1500–7800)
Neutrophils Relative %: 62 %
Platelets: 293 10*3/uL (ref 140–400)
RBC: 4.32 MIL/uL (ref 3.80–5.10)
RDW: 15.3 % — ABNORMAL HIGH (ref 11.0–15.0)
WBC: 4.6 10*3/uL (ref 3.8–10.8)

## 2016-06-16 LAB — POC URINALSYSI DIPSTICK (AUTOMATED)
Bilirubin, UA: POSITIVE
Glucose, UA: NEGATIVE
Ketones, UA: POSITIVE
Nitrite, UA: NEGATIVE
PH UA: 6
PROTEIN UA: POSITIVE
Spec Grav, UA: >=1.03
UROBILINOGEN UA: 2

## 2016-06-16 LAB — TSH: TSH: 1.03 mIU/L

## 2016-06-16 LAB — T4, FREE: Free T4: 1 ng/dL (ref 0.8–1.8)

## 2016-06-16 MED ORDER — ONDANSETRON 8 MG PO TBDP: 8.0000 mg | ORAL_TABLET | Freq: Three times a day (TID) | ORAL | 0 refills | Status: DC | PRN

## 2016-06-16 MED ORDER — NORGESTIMATE-ETH ESTRADIOL 0.25-35 MG-MCG PO TABS: 1.0000 | ORAL_TABLET | Freq: Every day | ORAL | 11 refills | Status: DC

## 2016-06-16 NOTE — Progress Notes (Signed)
Subjective:    Patient ID: Brooke Russell, female    DOB: Nov 02, 1990, 26 y.o.   MRN: [id number]  HPI  Pt here for first time.  Pt new from [place] for couple of years.   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 Pt does not exercise regularly, occasional caffeinated beverages, Pt is trying to eat healthy recently. Pt has a puppy 8 weeks.   LMP- 06-05-2016(hx of irregular cycles) Pt states irregular for 2 years. Last month stated bled for about one month. She found left over ocp and took then bleeding stopped. Pt never had US. No hx of any pregnancy.   Hx of asthma. Controlled for years.(3 yrs since any wheezing) Has neb machine if needs and has old neb solution.   Pt states last year lost 51 pounds. Pt stopped trying to lose weight. Pt states has gym membership and plans to work out.   Review of Systems  Constitutional: Negative for chills, fatigue and fever.  HENT: Negative for congestion, ear pain, sinus pressure and sore throat.   Respiratory: Negative for cough, chest tightness, shortness of breath and wheezing.   Cardiovascular: Negative for chest pain and palpitations.  Gastrointestinal: Positive for diarrhea. Negative for abdominal distention, abdominal pain, anal bleeding, blood in stool, nausea and vomiting.       Pt dranks some almond milk and later in day she had upset stomach. She vomited 3 times. Today none. Appetite coming back today.  First time every drinking almond milk.  Genitourinary: Negative for urgency, vaginal bleeding and vaginal pain.  Musculoskeletal: Negative for arthralgias and myalgias.  Neurological: Negative for dizziness, syncope, speech difficulty, weakness and light-headedness.  Hematological: Negative for adenopathy. Does not bruise/bleed easily.  Psychiatric/Behavioral: Negative for behavioral problems.   Past Medical History:  Diagnosis Date  . Allergy    spring.  . Asthma   . History of chicken pox   . Irregular menses        Social History   Social History  . Marital status: Single    Spouse name: N/A  . Number of children: N/A  . Years of education: N/A   Occupational History  . Not on file.   Social History Main Topics  . Smoking status: Never Smoker  . Smokeless tobacco: Never Used  . Alcohol use No     Comment: very rare  . Drug use: No  . Sexual activity: Yes    Birth control/ protection: Abstinence     Comment: in past active but presently abstinence.   Other Topics Concern  . Not on file   Social History Narrative  . No narrative on file    Past Surgical History:  Procedure Laterality Date  . NO PAST SURGERIES      Family History  Problem Relation Age of Onset  . Hypertension Mother   . Healthy Father   . Healthy Sister   . Healthy Brother   . Diabetes Maternal Grandmother     MGGM  . Ovarian cancer Maternal Grandmother     MGGM  . Alcohol abuse Neg Hx   . Arthritis Neg Hx   . Asthma Neg Hx   . Birth defects Neg Hx   . Cancer Neg Hx   . COPD Neg Hx   . Depression Neg Hx   . Drug abuse Neg Hx   . Early death Neg Hx   . Hearing loss Neg Hx   . Heart disease Neg Hx   .  Hyperlipidemia Neg Hx   . Kidney disease Neg Hx   . Learning disabilities Neg Hx   . Mental illness Neg Hx   . Mental retardation Neg Hx   . Miscarriages / Stillbirths Neg Hx   . Stroke Neg Hx   . Vision loss Neg Hx   . Varicose Veins Neg Hx     Allergies  Allergen Reactions  . Almond (Diagnostic) Nausea And Vomiting    Almond Milk    Current Outpatient Prescriptions on File Prior to Visit  Medication Sig Dispense Refill  . albuterol (PROVENTIL HFA;VENTOLIN HFA) 108 (90 BASE) MCG/ACT inhaler Inhale 2 puffs into the lungs every 6 (six) hours as needed for wheezing or shortness of breath.    . norgestimate-ethinyl estradiol (ORTHO-CYCLEN,SPRINTEC,PREVIFEM) 0.25-35 MG-MCG tablet 2 tablets daily for 5 days then 1 daily until appointment (Patient not taking: Reported on 06/16/2016) 1 Package 11    No current facility-administered medications on file prior to visit.     BP (!) 121/98 (BP Location: Right Arm, Patient Position: Sitting, Cuff Size: Large)   Pulse 90   Temp 98 F (36.7 C) (Oral)   Resp 16   Ht 5' 7.5" (1.715 m)   Wt (!) 357 lb 2 oz (162 kg)   LMP 06/05/2016 Comment: Irregular Menses  SpO2 100%   BMI 55.11 kg/m      Objective:   Physical Exam  General Appearance- Not in acute distress.  HEENT Eyes- Scleraeral/Conjuntiva-bilat- Not Yellow. Mouth & Throat- Normal.  Chest and Lung Exam Auscultation: Breath sounds:-Normal. Adventitious sounds:- No Adventitious sounds.  Cardiovascular Auscultation:Rythm - Regular. Heart Sounds -Normal heart sounds.  Abdomen Inspection:-Inspection Normal.  Palpation/Perucssion: Palpation and Percussion of the abdomen reveal- Non Tender, No Rebound tenderness, No rigidity(Guarding) and No Palpable abdominal masses.  Liver:-Normal.  Spleen:- Normal.   Back- no cva tenderness.      Assessment & Plan:  For you irregular menses will get urine pregnancy test. Test negative will rx sprintec. Refer to gyn for evaluation and pap.  For monthly irregular cycles will get cbc today.  For weight will get tsh and t4.Peri [place] job on 51 lb weight loss. Restart your diet and weight loss program. If you hit a plateau and want referral to weight loss specialist can refer just let us know.  Will get cmp to check sugar level.   Your nausea and vomiting may be reaction to almond milk. Recommend avoid in future. You may also have early viral illness. Bland food and avoid greasy foods. Zofran if nausea and vomiting re-occurs. If loose stools then will get stool panel by Monday if needed. Also if loose stools start then immodium otc.  Follow up in 7 days or as needed  Braylie Badami, Ramon Dredge, [employer]

## 2016-06-16 NOTE — Progress Notes (Signed)
Pre visit review using our clinic review tool, if applicable. No additional management support is needed unless otherwise documented below in the visit note/SLS  

## 2016-06-16 NOTE — Patient Instructions (Addendum)
For you irregular menses will get urine pregnancy test. Test negative will rx sprintec.Refer to gyn for evaluation and pap.  For monthly irregular cycles will get cbc today.  For weight will get tsh and t4. Good job on 51 lb weight loss. Restart your diet and weight loss program. If you hit a plateau and want referral to weight loss specialist can refer just let us know.  Will get cmp to check sugar level.   Your nausea and vomiting may be reaction to almond milk. Recommend avoid in future. You may also have early viral illness. Bland food and avoid greasy foods. Zofran if nausea and vomiting re-occurs. If loose stools then will get stool panel by Monday if needed. Also if loose stools start then immodium otc.  Follow up in 7 days or as needed  Will get urine culture.

## 2016-06-17 LAB — COMPREHENSIVE METABOLIC PANEL
ALBUMIN: 3.5 g/dL — AB (ref 3.6–5.1)
ALK PHOS: 57 U/L (ref 33–115)
ALT: 18 U/L (ref 6–29)
AST: 18 U/L (ref 10–30)
BILIRUBIN TOTAL: 0.4 mg/dL (ref 0.2–1.2)
BUN: 8 mg/dL (ref 7–25)
CO2: 23 mmol/L (ref 20–31)
Calcium: 8.6 mg/dL (ref 8.6–10.2)
Chloride: 106 mmol/L (ref 98–110)
Creat: 0.9 mg/dL (ref 0.50–1.10)
Glucose, Bld: 77 mg/dL (ref 65–99)
Potassium: 4 mmol/L (ref 3.5–5.3)
Sodium: 138 mmol/L (ref 135–146)
Total Protein: 7 g/dL (ref 6.1–8.1)

## 2016-06-19 LAB — CULTURE, URINE COMPREHENSIVE

## 2016-06-20 ENCOUNTER — Telehealth: Payer: Self-pay | Admitting: Medical

## 2016-06-20 ENCOUNTER — Encounter: Payer: Self-pay | Admitting: Medical

## 2016-06-20 MED ORDER — AMPICILLIN 500 MG PO CAPS: 500.0000 mg | ORAL_CAPSULE | Freq: Three times a day (TID) | ORAL | 0 refills | Status: DC

## 2016-06-20 NOTE — Telephone Encounter (Signed)
I wrote for zofran. Now pt has diarrhea. She thought initially had reaction to almond milk and vomited. No having symptoms more consistent with viral illness. Maybe type b flu or other illness. Offer her appointment tomorrow am early am  or tomorrow afternoon early. Probably due rapid flu and may give her stool panel kit. Just sent in ampicillin for uti. So some dilema in that antibiotic may promote loose stool.

## 2016-06-20 NOTE — Telephone Encounter (Signed)
Please schedule patient to see provider as he requested for either early Am or early tomorrow afternoon.

## 2016-06-20 NOTE — Telephone Encounter (Signed)
Reviewed not pt feels better and declined appointment with me.

## 2016-06-20 NOTE — Telephone Encounter (Signed)
rx ampicillin sent to pharmacy

## 2016-06-20 NOTE — Telephone Encounter (Signed)
Called patient and informed her that Brooke Russell wanted to see her tomorrow. Patient stated that she no longer is vomiting or has diarrhea and she works 12 hours tomorrow so she can not come in.

## 2016-06-23 ENCOUNTER — Encounter: Payer: Self-pay | Admitting: Women's Health

## 2016-06-23 ENCOUNTER — Ambulatory Visit (INDEPENDENT_AMBULATORY_CARE_PROVIDER_SITE_OTHER): Payer: BLUE CROSS/BLUE SHIELD | Admitting: Women's Health

## 2016-06-23 VITALS — BP 124/76 | Ht 67.0 in | Wt 356.0 lb

## 2016-06-23 DIAGNOSIS — Z01419 Encounter for gynecological examination (general) (routine) without abnormal findings: Secondary | ICD-10-CM

## 2016-06-23 DIAGNOSIS — Z113 Encounter for screening for infections with a predominantly sexual mode of transmission: Secondary | ICD-10-CM | POA: Diagnosis not present

## 2016-06-23 NOTE — Patient Instructions (Signed)
Levonorgestrel intrauterine device (IUD) What is this medicine? LEVONORGESTREL IUD (LEE voe nor jes trel) is a contraceptive (birth control) device. The device is placed inside the uterus by a healthcare professional. It is used to prevent pregnancy. This device can also be used to treat heavy bleeding that occurs during your period. This medicine may be used for other purposes; ask your health care provider or pharmacist if you have questions. COMMON BRAND NAME(S): Minette Headland What should I tell my health care provider before I take this medicine? They need to know if you have any of these conditions: -abnormal Pap smear -cancer of the breast, uterus, or cervix -diabetes -endometritis -genital or pelvic infection now or in the past -have more than one sexual partner or your partner has more than one partner -heart disease -history of an ectopic or tubal pregnancy -immune system problems -IUD in place -liver disease or tumor -problems with blood clots or take blood-thinners -seizures -use intravenous drugs -uterus of unusual shape -vaginal bleeding that has not been explained -an unusual or allergic reaction to levonorgestrel, other hormones, silicone, or polyethylene, medicines, foods, dyes, or preservatives -pregnant or trying to get pregnant -breast-feeding How should I use this medicine? This device is placed inside the uterus by a health care professional. Talk to your pediatrician regarding the use of this medicine in children. Special care may be needed. Overdosage: If you think you have taken too much of this medicine contact a poison control center or emergency room at once. NOTE: This medicine is only for you. Do not share this medicine with others. What if I miss a dose? This does not apply. Depending on the brand of device you have inserted, the device will need to be replaced every 3 to 5 years if you wish to continue using this type of birth  control. What may interact with this medicine? Do not take this medicine with any of the following medications: -amprenavir -bosentan -fosamprenavir This medicine may also interact with the following medications: -aprepitant -barbiturate medicines for inducing sleep or treating seizures -bexarotene -griseofulvin -medicines to treat seizures like carbamazepine, ethotoin, felbamate, oxcarbazepine, phenytoin, topiramate -modafinil -pioglitazone -rifabutin -rifampin -rifapentine -some medicines to treat HIV infection like atazanavir, indinavir, lopinavir, nelfinavir, tipranavir, ritonavir -St. John's wort -warfarin This list may not describe all possible interactions. Give your health care provider a list of all the medicines, herbs, non-prescription drugs, or dietary supplements you use. Also tell them if you smoke, drink alcohol, or use illegal drugs. Some items may interact with your medicine. What should I watch for while using this medicine? Visit your doctor or health care professional for regular check ups. See your doctor if you or your partner has sexual contact with others, becomes HIV positive, or gets a sexual transmitted disease. This product does not protect you against HIV infection (AIDS) or other sexually transmitted diseases. You can check the placement of the IUD yourself by reaching up to the top of your vagina with clean fingers to feel the threads. Do not pull on the threads. It is a good habit to check placement after each menstrual period. Call your doctor right away if you feel more of the IUD than just the threads or if you cannot feel the threads at all. The IUD may come out by itself. You may become pregnant if the device comes out. If you notice that the IUD has come out use a backup birth control method like condoms and call your health care provider.  Using tampons will not change the position of the IUD and are okay to use during your period. This IUD can be  safely scanned with magnetic resonance imaging (MRI) only under specific conditions. Before you have an MRI, tell your healthcare provider that you have an IUD in place, and which type of IUD you have in place. What side effects may I notice from receiving this medicine? Side effects that you should report to your doctor or health care professional as soon as possible: -allergic reactions like skin rash, itching or hives, swelling of the face, lips, or tongue -fever, flu-like symptoms -genital sores -high blood pressure -no menstrual period for 6 weeks during use -pain, swelling, warmth in the leg -pelvic pain or tenderness -severe or sudden headache -signs of pregnancy -stomach cramping -sudden shortness of breath -trouble with balance, talking, or walking -unusual vaginal bleeding, discharge -yellowing of the eyes or skin Side effects that usually do not require medical attention (report to your doctor or health care professional if they continue or are bothersome): -acne -breast pain -change in sex drive or performance -changes in weight -cramping, dizziness, or faintness while the device is being inserted -headache -irregular menstrual bleeding within first 3 to 6 months of use -nausea This list may not describe all possible side effects. Call your doctor for medical advice about side effects. You may report side effects to FDA at 1-800-FDA-1088. Where should I keep my medicine? This does not apply. NOTE: This sheet is a summary. It may not cover all possible information. If you have questions about this medicine, talk to your doctor, pharmacist, or health care provider.  2017 Elsevier/Gold Standard (2015-10-29 13:46:37) Carbohydrate Counting for Diabetes Mellitus, Adult Carbohydrate counting is a method for keeping track of how many carbohydrates you eat. Eating carbohydrates naturally increases the amount of sugar (glucose) in the blood. Counting how many carbohydrates you eat  helps keep your blood glucose within normal limits, which helps you manage your diabetes (diabetes mellitus). It is important to know how many carbohydrates you can safely have in each meal. This is different for every person. A diet and nutrition specialist (registered dietitian) can help you make a meal plan and calculate how many carbohydrates you should have at each meal and snack. Carbohydrates are found in the following foods:  Grains, such as breads and cereals.  Dried beans and soy products.  Starchy vegetables, such as potatoes, peas, and corn.  Fruit and fruit juices.  Milk and yogurt.  Sweets and snack foods, such as cake, cookies, candy, chips, and soft drinks. How do I count carbohydrates? There are two ways to count carbohydrates in food. You can use either of the methods or a combination of both. Reading "Nutrition Facts" on packaged food  The "Nutrition Facts" list is included on the labels of almost all packaged foods and beverages in the [place] It includes:  The serving size.  Information about nutrients in each serving, including the grams (g) of carbohydrate per serving. To use the "Nutrition Facts":  Decide how many servings you will have.  Multiply the number of servings by the number of carbohydrates per serving.  The resulting number is the total amount of carbohydrates that you will be having. Learning standard serving sizes of other foods  When you eat foods containing carbohydrates that are not packaged or do not include "Nutrition Facts" on the label, you need to measure the servings in order to count the amount of carbohydrates:  Measure the foods  that you will eat with a food scale or measuring cup, if needed.  Decide how many standard-size servings you will eat.  Multiply the number of servings by 15. Most carbohydrate-rich foods have about 15 g of carbohydrates per serving.  For example, if you eat 8 oz (170 g) of strawberries, you will have eaten  2 servings and 30 g of carbohydrates (2 servings x 15 g = 30 g).  For foods that have more than one food mixed, such as soups and casseroles, you must count the carbohydrates in each food that is included. The following list contains standard serving sizes of common carbohydrate-rich foods. Each of these servings has about 15 g of carbohydrates:   hamburger bun or  English muffin.   oz (15 mL) syrup.   oz (14 g) jelly.  1 slice of bread.  1 six-inch tortilla.  3 oz (85 g) cooked rice or pasta.  4 oz (113 g) cooked dried beans.  4 oz (113 g) starchy vegetable, such as peas, corn, or potatoes.  4 oz (113 g) hot cereal.  4 oz (113 g) mashed potatoes or  of a large baked potato.  4 oz (113 g) canned or frozen fruit.  4 oz (120 mL) fruit juice.  4-6 crackers.  6 chicken nuggets.  6 oz (170 g) unsweetened dry cereal.  6 oz (170 g) plain fat-free yogurt or yogurt sweetened with artificial sweeteners.  8 oz (240 mL) milk.  8 oz (170 g) fresh fruit or one small piece of fruit.  24 oz (680 g) popped popcorn. Example of carbohydrate counting Sample meal  3 oz (85 g) chicken breast.  6 oz (170 g) brown rice.  4 oz (113 g) corn.  8 oz (240 mL) milk.  8 oz (170 g) strawberries with sugar-free whipped topping. Carbohydrate calculation 1. Identify the foods that contain carbohydrates:  Rice.  Corn.  Milk.  Strawberries. 2. Calculate how many servings you have of each food:  2 servings rice.  1 serving corn.  1 serving milk.  1 serving strawberries. 3. Multiply each number of servings by 15 g:  2 servings rice x 15 g = 30 g.  1 serving corn x 15 g = 15 g.  1 serving milk x 15 g = 15 g.  1 serving strawberries x 15 g = 15 g. 4. Add together all of the amounts to find the total grams of carbohydrates eaten:  30 g + 15 g + 15 g + 15 g = 75 g of carbohydrates total. This information is not intended to replace advice given to you by your health care  provider. Make sure you discuss any questions you have with your health care provider. Document Released: 05/08/2005 Document Revised: 11/26/2015 Document Reviewed: 10/20/2015 Elsevier Interactive Patient Education  2017 [place] Maintenance, Female Introduction Adopting a healthy lifestyle and getting preventive care can go a long way to promote health and wellness. Talk with your health care provider about what schedule of regular examinations is right for you. This is a good chance for you to check in with your provider about disease prevention and staying healthy. In between checkups, there are plenty of things you can do on your own. Experts have done a lot of research about which lifestyle changes and preventive measures are most likely to keep you healthy. Ask your health care provider for more information. Weight and diet Eat a healthy diet  Be sure to include plenty of vegetables,  fruits, low-fat dairy products, and lean protein.  Do not eat a lot of foods high in solid fats, added sugars, or salt.  Get regular exercise. This is one of the most important things you can do for your health.  Most adults should exercise for at least 150 minutes each week. The exercise should increase your heart rate and make you sweat (moderate-intensity exercise).  Most adults should also do strengthening exercises at least twice a week. This is in addition to the moderate-intensity exercise. Maintain a healthy weight  Body mass index (BMI) is a measurement that can be used to identify possible weight problems. It estimates body fat based on height and weight. Your health care provider can help determine your BMI and help you achieve or maintain a healthy weight.  For females 57 years of age and older:  A BMI below 18.5 is considered underweight.  A BMI of 18.5 to 24.9 is normal.  A BMI of 25 to 29.9 is considered overweight.  A BMI of 30 and above is considered obese. Watch levels  of cholesterol and blood lipids  You should start having your blood tested for lipids and cholesterol at 25 years of age, then have this test every 5 years.  You may need to have your cholesterol levels checked more often if:  Your lipid or cholesterol levels are high.  You are older than 25 years of age.  You are at high risk for heart disease. Cancer screening Lung Cancer  Lung cancer screening is recommended for adults 83-79 years old who are at high risk for lung cancer because of a history of smoking.  A yearly low-dose CT scan of the lungs is recommended for people who:  Currently smoke.  Have quit within the past 15 years.  Have at least a 30-pack-year history of smoking. A pack year is smoking an average of one pack of cigarettes a day for 1 year.  Yearly screening should continue until it has been 15 years since you quit.  Yearly screening should stop if you develop a health problem that would prevent you from having lung cancer treatment. Breast Cancer  Practice breast self-awareness. This means understanding how your breasts normally appear and feel.  It also means doing regular breast self-exams. Let your health care provider know about any changes, no matter how small.  If you are in your 20s or 30s, you should have a clinical breast exam (CBE) by a health care provider every 1-3 years as part of a regular health exam.  If you are 52 or older, have a CBE every year. Also consider having a breast X-ray (mammogram) every year.  If you have a family history of breast cancer, talk to your health care provider about genetic screening.  If you are at high risk for breast cancer, talk to your health care provider about having an MRI and a mammogram every year.  Breast cancer gene (BRCA) assessment is recommended for women who have family members with BRCA-related cancers. BRCA-related cancers include:  Breast.  Ovarian.  Tubal.  Peritoneal cancers.  Results of  the assessment will determine the need for genetic counseling and BRCA1 and BRCA2 testing. Cervical Cancer  Your health care provider may recommend that you be screened regularly for cancer of the pelvic organs (ovaries, uterus, and vagina). This screening involves a pelvic examination, including checking for microscopic changes to the surface of your cervix (Pap test). You may be encouraged to have this screening done every  3 years, beginning at age 46.  For women ages 59-65, health care providers may recommend pelvic exams and Pap testing every 3 years, or they may recommend the Pap and pelvic exam, combined with testing for human papilloma virus (HPV), every 5 years. Some types of HPV increase your risk of cervical cancer. Testing for HPV may also be done on women of any age with unclear Pap test results.  Other health care providers may not recommend any screening for nonpregnant women who are considered low risk for pelvic cancer and who do not have symptoms. Ask your health care provider if a screening pelvic exam is right for you.  If you have had past treatment for cervical cancer or a condition that could lead to cancer, you need Pap tests and screening for cancer for at least 20 years after your treatment. If Pap tests have been discontinued, your risk factors (such as having a new sexual partner) need to be reassessed to determine if screening should resume. Some women have medical problems that increase the chance of getting cervical cancer. In these cases, your health care provider may recommend more frequent screening and Pap tests. Colorectal Cancer  This type of cancer can be detected and often prevented.  Routine colorectal cancer screening usually begins at 25 years of age and continues through 25 years of age.  Your health care provider may recommend screening at an earlier age if you have risk factors for colon cancer.  Your health care provider may also recommend using home test  kits to check for hidden blood in the stool.  A small camera at the end of a tube can be used to examine your colon directly (sigmoidoscopy or colonoscopy). This is done to check for the earliest forms of colorectal cancer.  Routine screening usually begins at age 42.  Direct examination of the colon should be repeated every 5-10 years through 25 years of age. However, you may need to be screened more often if early forms of precancerous polyps or small growths are found. Skin Cancer  Check your skin from head to toe regularly.  Tell your health care provider about any new moles or changes in moles, especially if there is a change in a mole's shape or color.  Also tell your health care provider if you have a mole that is larger than the size of a pencil eraser.  Always use sunscreen. Apply sunscreen liberally and repeatedly throughout the day.  Protect yourself by wearing long sleeves, pants, a wide-brimmed hat, and sunglasses whenever you are outside. Heart disease, diabetes, and high blood pressure  High blood pressure causes heart disease and increases the risk of stroke. High blood pressure is more likely to develop in:  People who have blood pressure in the high end of the normal range (130-139/85-89 mm Hg).  People who are overweight or obese.  People who are African American.  If you are 89-65 years of age, have your blood pressure checked every 3-5 years. If you are 75 years of age or older, have your blood pressure checked every year. You should have your blood pressure measured twice-once when you are at a hospital or clinic, and once when you are not at a hospital or clinic. Record the average of the two measurements. To check your blood pressure when you are not at a hospital or clinic, you can use:  An automated blood pressure machine at a pharmacy.  A home blood pressure monitor.  If you are  between 22 years and 68 years old, ask your health care provider if you should  take aspirin to prevent strokes.  Have regular diabetes screenings. This involves taking a blood sample to check your fasting blood sugar level.  If you are at a normal weight and have a low risk for diabetes, have this test once every three years after 25 years of age.  If you are overweight and have a high risk for diabetes, consider being tested at a younger age or more often. Preventing infection Hepatitis B  If you have a higher risk for hepatitis B, you should be screened for this virus. You are considered at high risk for hepatitis B if:  You were born in a country where hepatitis B is common. Ask your health care provider which countries are considered high risk.  Your parents were born in a high-risk country, and you have not been immunized against hepatitis B (hepatitis B vaccine).  You have HIV or AIDS.  You use needles to inject street drugs.  You live with someone who has hepatitis B.  You have had sex with someone who has hepatitis B.  You get hemodialysis treatment.  You take certain medicines for conditions, including cancer, organ transplantation, and autoimmune conditions. Hepatitis C  Blood testing is recommended for:  Everyone born from [birth year] through 1964.  Anyone with known risk factors for hepatitis C. Sexually transmitted infections (STIs)  You should be screened for sexually transmitted infections (STIs) including gonorrhea and chlamydia if:  You are sexually active and are younger than 25 years of age.  You are older than 25 years of age and your health care provider tells you that you are at risk for this type of infection.  Your sexual activity has changed since you were last screened and you are at an increased risk for chlamydia or gonorrhea. Ask your health care provider if you are at risk.  If you do not have HIV, but are at risk, it may be recommended that you take a prescription medicine daily to prevent HIV infection. This is called  pre-exposure prophylaxis (PrEP). You are considered at risk if:  You are sexually active and do not regularly use condoms or know the HIV status of your partner(s).  You take drugs by injection.  You are sexually active with a partner who has HIV. Talk with your health care provider about whether you are at high risk of being infected with HIV. If you choose to begin PrEP, you should first be tested for HIV. You should then be tested every 3 months for as long as you are taking PrEP. Pregnancy  If you are premenopausal and you may become pregnant, ask your health care provider about preconception counseling.  If you may become pregnant, take 400 to 800 micrograms (mcg) of folic acid every day.  If you want to prevent pregnancy, talk to your health care provider about birth control (contraception). Osteoporosis and menopause  Osteoporosis is a disease in which the bones lose minerals and strength with aging. This can result in serious bone fractures. Your risk for osteoporosis can be identified using a bone density scan.  If you are 5 years of age or older, or if you are at risk for osteoporosis and fractures, ask your health care provider if you should be screened.  Ask your health care provider whether you should take a calcium or vitamin D supplement to lower your risk for osteoporosis.  Menopause may have certain physical  symptoms and risks.  Hormone replacement therapy may reduce some of these symptoms and risks. Talk to your health care provider about whether hormone replacement therapy is right for you. Follow these instructions at home:  Schedule regular health, dental, and eye exams.  Stay current with your immunizations.  Do not use any tobacco products including cigarettes, chewing tobacco, or electronic cigarettes.  If you are pregnant, do not drink alcohol.  If you are breastfeeding, limit how much and how often you drink alcohol.  Limit alcohol intake to no more  than 1 drink per day for nonpregnant women. One drink equals 12 ounces of beer, 5 ounces of wine, or 1 ounces of hard liquor.  Do not use street drugs.  Do not share needles.  Ask your health care provider for help if you need support or information about quitting drugs.  Tell your health care provider if you often feel depressed.  Tell your health care provider if you have ever been abused or do not feel safe at home. This information is not intended to replace advice given to you by your health care provider. Make sure you discuss any questions you have with your health care provider. Document Released: 11/21/2010 Document Revised: 10/14/2015 Document Reviewed: 02/09/2015  2017 Elsevier Ovarian Cyst An ovarian cyst is a fluid-filled sac on an ovary. The ovaries are organs that make eggs in women. Most ovarian cysts go away on their own and are not cancerous (are benign). Some cysts need treatment. Follow these instructions at home:  Take over-the-counter and prescription medicines only as told by your doctor.  Do not drive or use heavy machinery while taking prescription pain medicine.  Get pelvic exams and Pap tests as often as told by your doctor.  Return to your normal activities as told by your doctor. Ask your doctor what activities are safe for you.  Do not use any products that contain nicotine or tobacco, such as cigarettes and e-cigarettes. If you need help quitting, ask your doctor.  Keep all follow-up visits as told by your doctor. This is important. Contact a doctor if:  Your periods are:  Late.  Irregular.  Painful.  Your periods stop.  You have pelvic pain that does not go away.  You have pressure on your bladder.  You have trouble making your bladder empty when you pee (urinate).  You have pain during sex.  You have any of the following in your belly (abdomen):  A feeling of fullness.  Pressure.  Discomfort.  Pain that does not go  away.  Swelling.  You feel sick most of the time.  You have trouble pooping (have constipation).  You are not as hungry as usual (you lose your appetite).  You get very bad acne.  You start to have more hair on your body and face.  You are gaining weight or losing weight without changing your exercise and eating habits.  You think you may be pregnant. Get help right away if:  You have belly pain that is very bad or gets worse.  You cannot eat or drink without throwing up (vomiting).  You suddenly get a fever.  Your period is a lot heavier than usual. This information is not intended to replace advice given to you by your health care provider. Make sure you discuss any questions you have with your health care provider. Document Released: 10/25/2007 Document Revised: 11/26/2015 Document Reviewed: 10/10/2015 Elsevier Interactive Patient Education  2017 [employer].

## 2016-06-23 NOTE — Progress Notes (Signed)
Brooke Russell 30-Jun-1990 [id number]    History:    Presents for annual exam.  Amenorrheic on Sprintec, occasional light period, has been on for the past year. Irregular cycles when not on OCs. Regular monthly cycles when in high school, gained a large amount of weight after high school and that is when cycles became irregular. Gardasil series in high school. Primary care manages labs. Requesting STD screen. Blood pressure was elevated at primary care normal today. History of chlamydia in 2016.  Past medical history, past surgical history, family history and social history were all reviewed and documented in the EPIC chart. [facility] [employer] plans to graduate this year. Mother hypertension, father healthy. Both parents obese.  ROS:  A ROS was performed and pertinent positives and negatives are included.  Exam:  Vitals:   06/23/16 1211  BP: 124/76  Weight: (!) 356 lb (161.5 kg)  Height: 5\' 7"  (1.702 m)   Body mass index is 55.76 kg/m.   General appearance:  Normal Thyroid:  Symmetrical, normal in size, without palpable masses or nodularity. Respiratory  Auscultation:  Clear without wheezing or rhonchi Cardiovascular  Auscultation:  Regular rate, without rubs, murmurs or gallops  Edema/varicosities:  Not grossly evident Abdominal  Soft,nontender, without masses, guarding or rebound.  Liver/spleen:  No organomegaly noted  Hernia:  None appreciated  Skin  Inspection:  Grossly normal   Breasts: Examined lying and sitting.     Right: Without masses, retractions, discharge or axillary adenopathy.     Left: Without masses, retractions, discharge or axillary adenopathy. Gentitourinary   Inguinal/mons:  Normal without inguinal adenopathy  External genitalia:  Normal  BUS/Urethra/Skene's glands:  Normal  Vagina:  Normal  Cervix:  Normal  Uterus:  normal in size, shape and contour.  Midline and mobile  Adnexa/parametria:     Rt: Without masses or tenderness.   Lt: Without masses  or tenderness.  Anus and perineum: Normal    Assessment/Plan:  26 y.o. SBF G0  for annual exam.    Amenorrhea on Sprintec per primary care Morbid obesity Contraception management STD screen  Plan: Contraception options reviewed, plans to continue on Sprintec at this time, condoms encouraged if sexually active. Mirena IUD information discussed, reviewed slight risk for infection, perforation and hemorrhage. Reviewed placed with a cycle. Reviewed importance of decreasing calories/carbohydrates and increasing exercise for weight loss. SBE's, exercise, calcium rich diet, MVI daily encouraged. Campus safety reviewed. Pap, GC/Chlamydia, HIV, hep B, C, RPR.    Harrington ChallengerYOUNG,Brooke Russell J WHNP, 1:17 PM 06/23/2016

## 2016-06-24 LAB — HEPATITIS C ANTIBODY: HCV Ab: NEGATIVE

## 2016-06-24 LAB — GC/CHLAMYDIA PROBE AMP
CT Probe RNA: NOT DETECTED
GC PROBE AMP APTIMA: NOT DETECTED

## 2016-06-24 LAB — HEPATITIS B SURFACE ANTIGEN: Hepatitis B Surface Ag: NEGATIVE

## 2016-06-24 LAB — HIV ANTIBODY (ROUTINE TESTING W REFLEX): HIV 1&2 Ab, 4th Generation: NONREACTIVE

## 2016-06-24 LAB — RPR

## 2016-06-26 LAB — PAP IG W/ RFLX HPV ASCU

## 2016-08-03 ENCOUNTER — Ambulatory Visit (INDEPENDENT_AMBULATORY_CARE_PROVIDER_SITE_OTHER): Payer: BLUE CROSS/BLUE SHIELD | Admitting: Medical

## 2016-08-03 ENCOUNTER — Encounter: Payer: Self-pay | Admitting: Medical

## 2016-08-03 ENCOUNTER — Other Ambulatory Visit (HOSPITAL_COMMUNITY)
Admission: RE | Admit: 2016-08-03 | Discharge: 2016-08-03 | Disposition: A | Discharge status: Home / Self Care | Payer: BLUE CROSS/BLUE SHIELD | Source: Ambulatory Visit | Attending: Medical | Admitting: Medical

## 2016-08-03 VITALS — BP 120/70 | HR 87 | Temp 98.2°F | Resp 16 | Ht 67.0 in | Wt 358.5 lb

## 2016-08-03 DIAGNOSIS — Z202 Contact with and (suspected) exposure to infections with a predominantly sexual mode of transmission: Secondary | ICD-10-CM | POA: Diagnosis present

## 2016-08-03 DIAGNOSIS — N926 Irregular menstruation, unspecified: Secondary | ICD-10-CM | POA: Diagnosis not present

## 2016-08-03 DIAGNOSIS — B009 Herpesviral infection, unspecified: Secondary | ICD-10-CM | POA: Insufficient documentation

## 2016-08-03 DIAGNOSIS — N898 Other specified noninflammatory disorders of vagina: Secondary | ICD-10-CM | POA: Diagnosis not present

## 2016-08-03 LAB — POCT URINE PREGNANCY: PREG TEST UR: NEGATIVE

## 2016-08-03 MED ORDER — FLUCONAZOLE 150 MG PO TABS: 150.0000 mg | ORAL_TABLET | Freq: Once | ORAL | 0 refills | Status: AC

## 2016-08-03 MED ORDER — METRONIDAZOLE 500 MG PO TABS
500.0000 mg | ORAL_TABLET | Freq: Two times a day (BID) | ORAL | 0 refills | Status: DC
Start: 2016-08-03 — End: 2016-10-02

## 2016-08-03 NOTE — Patient Instructions (Signed)
For your recent exposure to partner with  Trichomonas will rx flagyl and do urine ancillary studies.(will get urine culture as well)  If your symptom change or worsen let us know. We will update you on study results as they come in.  Follow up in 7 days or as needed

## 2016-08-03 NOTE — Progress Notes (Signed)
Subjective:    Patient ID: Brooke Russell, female    DOB: [birth year], 26 y.o.   MRN: [id number]  HPI  Pt in with report of some vaginal discharge and some pain. She reported partner recently told her he was being treated for trichomonas.(he was treated about 2 weeks ago)  Pt states has some burn and irritation. Some burning. Some creamy white discharge.  No pain direct over bladder. No back pain. No nausea, no vomiting or fever.   LMP-  06-26-2015.   Review of Systems  Constitutional: Negative for chills, fatigue and fever.  Respiratory: Negative for cough, chest tightness, shortness of breath and wheezing.   Cardiovascular: Negative for chest pain and palpitations.  Gastrointestinal: Negative for abdominal pain.  Genitourinary: Positive for dysuria and vaginal discharge. Negative for difficulty urinating, flank pain, genital sores, hematuria, urgency, vaginal bleeding and vaginal pain.  Musculoskeletal: Negative for back pain, joint swelling and neck stiffness.  Skin: Negative for rash.  Neurological: Negative for dizziness and headaches.  Hematological: Negative for adenopathy. Does not bruise/bleed easily.  Psychiatric/Behavioral: Negative for behavioral problems and confusion.   Past Medical History:  Diagnosis Date  . Allergy    spring.  . Asthma   . History of chicken pox   . Irregular menses      Social History   Social History  . Marital status: Single    Spouse name: N/A  . Number of children: N/A  . Years of education: N/A   Occupational History  . Not on file.   Social History Main Topics  . Smoking status: Never Smoker  . Smokeless tobacco: Never Used  . Alcohol use No     Comment: very rare  . Drug use: No  . Sexual activity: Not Currently    Birth control/ protection: Abstinence     Comment: 1ST INTERCOURSE- 65, PARTNERS- 1 . in past active but presently abstinence.   Other Topics Concern  . Not on file   Social History Narrative  . No  narrative on file    Past Surgical History:  Procedure Laterality Date  . NO PAST SURGERIES      Family History  Problem Relation Age of Onset  . Hypertension Mother   . Healthy Father   . Healthy Sister   . Healthy Brother   . Diabetes Maternal Grandmother     MGGM  . Ovarian cancer Maternal Grandmother     MGGM  . Alcohol abuse Neg Hx   . Arthritis Neg Hx   . Asthma Neg Hx   . Birth defects Neg Hx   . Cancer Neg Hx   . COPD Neg Hx   . Depression Neg Hx   . Drug abuse Neg Hx   . Early death Neg Hx   . Hearing loss Neg Hx   . Heart disease Neg Hx   . Hyperlipidemia Neg Hx   . Kidney disease Neg Hx   . Learning disabilities Neg Hx   . Mental illness Neg Hx   . Mental retardation Neg Hx   . Miscarriages / Stillbirths Neg Hx   . Stroke Neg Hx   . Vision loss Neg Hx   . Varicose Veins Neg Hx     Allergies  Allergen Reactions  . Almond (Diagnostic) Nausea And Vomiting    Almond Milk    Current Outpatient Prescriptions on File Prior to Visit  Medication Sig Dispense Refill  . albuterol (PROVENTIL HFA;VENTOLIN HFA) 108 (90 BASE) MCG/ACT inhaler Inhale  2 puffs into the lungs every 6 (six) hours as needed for wheezing or shortness of breath.    Marland Kitchen. ampicillin (PRINCIPEN) 500 MG capsule Take 1 capsule (500 mg total) by mouth 3 (three) times daily. 30 capsule 0   No current facility-administered medications on file prior to visit.     BP (!) 0/0 (BP Location: Right Arm, Patient Position: Sitting, Cuff Size: Large)   Pulse 87   Temp 98.2 F (36.8 C) (Oral)   Resp 16   Ht 5\' 7"  (1.702 m)   Wt (!) 358 lb 8 oz (162.6 kg)   LMP 06/22/2016   SpO2 100%   BMI 56.15 kg/m       Objective:   Physical Exam  General- No acute distress. Pleasant patient. Neck- Full range of motion, no jvd Lungs- Clear, even and unlabored. Heart- regular rate and rhythm. Neurologic- CNII- XII grossly intact.   Abdomen- soft, nondistended, +bs, no rebound or guarding and no  organomegaly. No suprabpubic tenderness  Back- no cva tenderness      Assessment & Plan:  For your recent exposure to partner with  Trichomonas will rx flagyl and do urine ancillary studies.(will get urine culture as well)  If your symptom change or worsen let us know. We will update you on study results as they come in.  Follow up in 7 days or as needed  Khaidyn Staebell, Ramon DredgeEdward, VF CorporationPA-C

## 2016-08-03 NOTE — Progress Notes (Signed)
Pre visit review using our clinic review tool, if applicable. No additional management support is needed unless otherwise documented below in the visit note/SLS  

## 2016-08-04 LAB — HIV ANTIBODY (ROUTINE TESTING W REFLEX): HIV 1&2 Ab, 4th Generation: NONREACTIVE

## 2016-08-04 LAB — HSV(HERPES SIMPLEX VRS) I + II AB-IGG: HSV 1 GLYCOPROTEIN G AB, IGG: 4.94 {index} — AB (ref <0.90)

## 2016-08-04 LAB — RPR

## 2016-08-04 LAB — CULTURE, URINE COMPREHENSIVE

## 2016-08-05 LAB — URINE CYTOLOGY ANCILLARY ONLY
Chlamydia: NEGATIVE
Neisseria Gonorrhea: NEGATIVE
Trichomonas: POSITIVE — AB

## 2016-08-06 ENCOUNTER — Telehealth: Payer: Self-pay | Admitting: Medical

## 2016-08-06 LAB — HSV 1/2 AB (IGM), IFA W/RFLX TITER
HSV 1 IgM Screen: NEGATIVE
HSV 2 IgM Screen: NEGATIVE

## 2016-08-06 MED ORDER — AMOXICILLIN 500 MG PO CAPS: 500.0000 mg | ORAL_CAPSULE | Freq: Three times a day (TID) | ORAL | 0 refills | Status: DC

## 2016-08-06 NOTE — Telephone Encounter (Signed)
rx amoxicillin sent to pt pharmacy.

## 2016-08-07 LAB — URINE CYTOLOGY ANCILLARY ONLY
Bacterial vaginitis: NEGATIVE
Candida vaginitis: NEGATIVE

## 2016-10-02 ENCOUNTER — Telehealth: Payer: Self-pay | Admitting: Medical

## 2016-10-02 ENCOUNTER — Other Ambulatory Visit (HOSPITAL_COMMUNITY)
Admission: RE | Admit: 2016-10-02 | Discharge: 2016-10-02 | Disposition: A | Discharge status: Home / Self Care | Payer: BLUE CROSS/BLUE SHIELD | Source: Ambulatory Visit | Attending: Medical | Admitting: Medical

## 2016-10-02 ENCOUNTER — Encounter: Payer: Self-pay | Admitting: Medical

## 2016-10-02 ENCOUNTER — Ambulatory Visit (INDEPENDENT_AMBULATORY_CARE_PROVIDER_SITE_OTHER): Payer: BLUE CROSS/BLUE SHIELD | Admitting: Medical

## 2016-10-02 VITALS — BP 121/76 | HR 78 | Temp 97.8°F | Resp 16 | Ht 67.0 in | Wt 363.4 lb

## 2016-10-02 DIAGNOSIS — N926 Irregular menstruation, unspecified: Secondary | ICD-10-CM | POA: Diagnosis not present

## 2016-10-02 DIAGNOSIS — Z113 Encounter for screening for infections with a predominantly sexual mode of transmission: Secondary | ICD-10-CM

## 2016-10-02 DIAGNOSIS — Z87440 Personal history of urinary (tract) infections: Secondary | ICD-10-CM

## 2016-10-02 DIAGNOSIS — Z202 Contact with and (suspected) exposure to infections with a predominantly sexual mode of transmission: Secondary | ICD-10-CM

## 2016-10-02 LAB — HCG, QUANTITATIVE, PREGNANCY: Quantitative HCG: 0.31 m[IU]/mL

## 2016-10-02 NOTE — Telephone Encounter (Signed)
Placed orders so MA can result.

## 2016-10-02 NOTE — Progress Notes (Signed)
Pre visit review using our clinic review tool, if applicable. No additional management support is needed unless otherwise documented below in the visit note. 

## 2016-10-02 NOTE — Progress Notes (Signed)
Subjective:    Patient ID: Brooke Russell, female    DOB: Sep 28, 1990, 26 y.o.   MRN: [id number]  HPI   Pt in for follow for possible pregnancy.   Pt states she is stressed. Pt had been on both flagyl and on positive culture on urine test. Had group b strep. Following that test pt put on ampicillin. Pt is on birth control sprintec. Pt states at home pregnancy test showed one positive and second test was negative. She wants retesting.   Trich was positive in past. Pt treated with flagyl. No uti symptoms. No dc.  Pt is obese and she does mention also at end of exam desire to loose weight.   Review of Systems  Constitutional: Negative for chills, fatigue and fever.  Respiratory: Negative for cough, chest tightness, shortness of breath and wheezing.   Cardiovascular: Negative for chest pain and palpitations.  Gastrointestinal: Negative for abdominal pain, constipation, diarrhea, nausea and vomiting.  Genitourinary: Negative for decreased urine volume, difficulty urinating, dysuria, frequency, hematuria, menstrual problem, pelvic pain and vaginal pain.  Musculoskeletal: Negative for back pain and gait problem.  Skin: Negative for rash.  Neurological: Negative for dizziness, speech difficulty, weakness, numbness and headaches.  Hematological: Negative for adenopathy. Does not bruise/bleed easily.  Psychiatric/Behavioral: Negative for behavioral problems and confusion. The patient is not nervous/anxious.     Past Medical History:  Diagnosis Date  . Allergy    spring.  . Asthma   . History of chicken pox   . Irregular menses      Social History   Social History  . Marital status: Single    Spouse name: N/A  . Number of children: N/A  . Years of education: N/A   Occupational History  . Not on file.   Social History Main Topics  . Smoking status: Never Smoker  . Smokeless tobacco: Never Used  . Alcohol use No     Comment: very rare  . Drug use: No  . Sexual activity: Not  Currently    Birth control/ protection: Abstinence     Comment: 1ST INTERCOURSE- [age], PARTNERS- 1 . in past active but presently abstinence.   Other Topics Concern  . Not on file   Social History Narrative  . No narrative on file    Past Surgical History:  Procedure Laterality Date  . NO PAST SURGERIES      Family History  Problem Relation Age of Onset  . Hypertension Mother   . Healthy Father   . Healthy Sister   . Healthy Brother   . Diabetes Maternal Grandmother        MGGM  . Ovarian cancer Maternal Grandmother        MGGM  . Alcohol abuse Neg Hx   . Arthritis Neg Hx   . Asthma Neg Hx   . Birth defects Neg Hx   . Cancer Neg Hx   . COPD Neg Hx   . Depression Neg Hx   . Drug abuse Neg Hx   . Early death Neg Hx   . Hearing loss Neg Hx   . Heart disease Neg Hx   . Hyperlipidemia Neg Hx   . Kidney disease Neg Hx   . Learning disabilities Neg Hx   . Mental illness Neg Hx   . Mental retardation Neg Hx   . Miscarriages / Stillbirths Neg Hx   . Stroke Neg Hx   . Vision loss Neg Hx   . Varicose Veins Neg Hx  Allergies  Allergen Reactions  . Almond (Diagnostic) Nausea And Vomiting    Almond Milk    No current outpatient prescriptions on file prior to visit.   No current facility-administered medications on file prior to visit.     BP 121/76 (BP Location: Right Arm, Patient Position: Sitting, Cuff Size: Large)   Pulse 78   Temp 97.8 F (36.6 C) (Oral)   Resp 16   Ht 5\' 7"  (1.702 m)   Wt (!) 363 lb 6.4 oz (164.8 kg)   LMP 06/22/2016   SpO2 99%   BMI 56.92 kg/m       Objective:   Physical Exam  General Appearance- Not in acute distress.  HEENT Eyes- Scleraeral/Conjuntiva-bilat- Not Yellow. Mouth & Throat- Normal.  Chest and Lung Exam Auscultation: Breath sounds:-Normal. Adventitious sounds:- No Adventitious sounds.  Cardiovascular Auscultation:Rythm - Regular. Heart Sounds -Normal heart sounds.  Abdomen Inspection:-Inspection Normal.    Palpation/Perucssion: Palpation and Percussion of the abdomen reveal- Non Tender, No Rebound tenderness, No rigidity(Guarding) and No Palpable abdominal masses.  Liver:-Normal.  Spleen:- Normal.   Back- no cva tenderness.      Assessment & Plan:  For your history of trichomonas will repeat urine ancillary studies and see if any of test on ancillary come back positive.   Will get repeat urine culture due to hx of group b strep.   Your urine preg test was negative. But since you had one negative and one positive test at home I do think it is best to get blood work.   Regarding you desired weight loss will refer you to weigh loss specialist in our office.  Follow up as needed

## 2016-10-02 NOTE — Patient Instructions (Addendum)
For your history of trichomonas will repeat urine ancillary studies and see if any of test on ancillary come back positive.   Will get repeat urine culture due to hx of group b strep.   Your urine preg test was negative. But since you had one negative and one positive test at home I do think it is best to get blood work.   Regarding you desired weight loss will refer you to weigh loss specialist in our office.  Follow up as needed

## 2016-10-03 LAB — URINE CYTOLOGY ANCILLARY ONLY
Chlamydia: NEGATIVE
Neisseria Gonorrhea: NEGATIVE
Trichomonas: NEGATIVE

## 2016-10-04 ENCOUNTER — Encounter: Payer: Self-pay | Admitting: Gynecology

## 2016-12-14 ENCOUNTER — Encounter (INDEPENDENT_AMBULATORY_CARE_PROVIDER_SITE_OTHER): Payer: BLUE CROSS/BLUE SHIELD | Admitting: Family Medicine

## 2017-01-02 ENCOUNTER — Encounter (INDEPENDENT_AMBULATORY_CARE_PROVIDER_SITE_OTHER): Payer: BLUE CROSS/BLUE SHIELD

## 2017-01-14 ENCOUNTER — Emergency Department (HOSPITAL_COMMUNITY)
Admission: EM | Admit: 2017-01-14 | Discharge: 2017-01-14 | Disposition: A | Discharge status: Home / Self Care | Payer: BLUE CROSS/BLUE SHIELD | Attending: Emergency Medicine | Admitting: Emergency Medicine

## 2017-01-14 ENCOUNTER — Encounter (HOSPITAL_COMMUNITY): Payer: Self-pay | Admitting: Emergency Medicine

## 2017-01-14 ENCOUNTER — Emergency Department (HOSPITAL_COMMUNITY): Payer: BLUE CROSS/BLUE SHIELD

## 2017-01-14 DIAGNOSIS — N898 Other specified noninflammatory disorders of vagina: Secondary | ICD-10-CM

## 2017-01-14 DIAGNOSIS — J4521 Mild intermittent asthma with (acute) exacerbation: Secondary | ICD-10-CM | POA: Insufficient documentation

## 2017-01-14 DIAGNOSIS — N899 Noninflammatory disorder of vagina, unspecified: Secondary | ICD-10-CM | POA: Diagnosis not present

## 2017-01-14 DIAGNOSIS — R0981 Nasal congestion: Secondary | ICD-10-CM | POA: Diagnosis present

## 2017-01-14 DIAGNOSIS — J209 Acute bronchitis, unspecified: Secondary | ICD-10-CM | POA: Diagnosis not present

## 2017-01-14 LAB — URINALYSIS, ROUTINE W REFLEX MICROSCOPIC
Bilirubin Urine: NEGATIVE
Glucose, UA: NEGATIVE mg/dL
HGB URINE DIPSTICK: NEGATIVE
Ketones, ur: NEGATIVE mg/dL
NITRITE: NEGATIVE
PROTEIN: NEGATIVE mg/dL
SPECIFIC GRAVITY, URINE: 1.024 (ref 1.005–1.030)
pH: 5 (ref 5.0–8.0)

## 2017-01-14 LAB — PREGNANCY, URINE: Preg Test, Ur: NEGATIVE

## 2017-01-14 LAB — WET PREP, GENITAL
Clue Cells Wet Prep HPF POC: NONE SEEN
Sperm: NONE SEEN
TRICH WET PREP: NONE SEEN
YEAST WET PREP: NONE SEEN

## 2017-01-14 MED ORDER — ALBUTEROL SULFATE HFA 108 (90 BASE) MCG/ACT IN AERS: 1.0000 | INHALATION_SPRAY | Freq: Four times a day (QID) | RESPIRATORY_TRACT | 0 refills | Status: DC | PRN

## 2017-01-14 MED ORDER — ALBUTEROL SULFATE (2.5 MG/3ML) 0.083% IN NEBU
INHALATION_SOLUTION | RESPIRATORY_TRACT | Status: AC
  Filled 2017-01-14: qty 3

## 2017-01-14 MED ORDER — ALBUTEROL SULFATE (2.5 MG/3ML) 0.083% IN NEBU
5.0000 mg | INHALATION_SOLUTION | Freq: Once | RESPIRATORY_TRACT | Status: AC
  Administered 2017-01-14: 5 mg via RESPIRATORY_TRACT

## 2017-01-14 MED ORDER — BENZONATATE 100 MG PO CAPS: 100.0000 mg | ORAL_CAPSULE | Freq: Three times a day (TID) | ORAL | 0 refills | Status: DC

## 2017-01-14 MED ORDER — PREDNISONE 20 MG PO TABS: ORAL_TABLET | ORAL | 0 refills | Status: DC

## 2017-01-14 NOTE — ED Triage Notes (Signed)
Pt reports 2 weeks of post nasal drip with cough, productive with yellow mucous. Pt also states she has asthma and it is bothering her asthma. Pt states she was started on new birth control 1 month ago and has had clear vaginal discharge, also states she wants to be checked for stds while she is here. PT states vaginal odor. She thinks this could be related to her new birth control.

## 2017-01-14 NOTE — ED Notes (Signed)
Declined W/C at D/C and was escorted to lobby by RN. 

## 2017-01-14 NOTE — ED Provider Notes (Signed)
[facility]-EMERGENCY DEPT Provider Note   CSN: [id number] Arrival date & time: 01/14/17  1051     History   Chief Complaint Chief Complaint  Patient presents with  . Cough  . Asthma  . female complaints    HPI Brooke Russell is a 26 y.o. female.  HPI Patient symptoms started with nasal congestion and drainage or sore throat. She reports that this is on on to aggravate her asthma. She reports that she's having cough is productive of yellow tinged sputum. Central upper chest pain with coughing. No fever. Patient for symptoms are making her asthma worse. She is using inhaler more than often. Patient also reports that she is started on new birth control and has been having some vaginal discharge. She reports no known STD exposure but is concerned for this. No abdominal pain no fever no nausea no vomiting. Past Medical History:  Diagnosis Date  . Allergy    spring.  . Asthma   . History of chicken pox   . Irregular menses     Patient Active Problem List   Diagnosis Date Noted  . Morbid obesity (HCC) 06/23/2016    Past Surgical History:  Procedure Laterality Date  . NO PAST SURGERIES      OB History    Gravida Para Term Preterm AB Living   0             SAB TAB Ectopic Multiple Live Births                   Home Medications    Prior to Admission medications   Medication Sig Start Date End Date Taking? Authorizing Provider  albuterol (PROVENTIL HFA;VENTOLIN HFA) 108 (90 Base) MCG/ACT inhaler Inhale 1-2 puffs into the lungs every 6 (six) hours as needed for wheezing or shortness of breath. 01/14/17   Arby Barrette, MD  benzonatate (TESSALON) 100 MG capsule Take 1 capsule (100 mg total) by mouth every 8 (eight) hours. 01/14/17   Arby Barrette, MD  predniSONE (DELTASONE) 20 MG tablet 2 tabs po daily x 3 days 01/14/17   Arby Barrette, MD    Family History Family History  Problem Relation Age of Onset  . Hypertension Mother   . Healthy Father   . Healthy Sister   .  Healthy Brother   . Diabetes Maternal Grandmother        MGGM  . Ovarian cancer Maternal Grandmother        MGGM  . Alcohol abuse Neg Hx   . Arthritis Neg Hx   . Asthma Neg Hx   . Birth defects Neg Hx   . Cancer Neg Hx   . COPD Neg Hx   . Depression Neg Hx   . Drug abuse Neg Hx   . Early death Neg Hx   . Hearing loss Neg Hx   . Heart disease Neg Hx   . Hyperlipidemia Neg Hx   . Kidney disease Neg Hx   . Learning disabilities Neg Hx   . Mental illness Neg Hx   . Mental retardation Neg Hx   . Miscarriages / Stillbirths Neg Hx   . Stroke Neg Hx   . Vision loss Neg Hx   . Varicose Veins Neg Hx     Social History Social History  Substance Use Topics  . Smoking status: Never Smoker  . Smokeless tobacco: Never Used  . Alcohol use No     Comment: very rare     Allergies   Almond (  diagnostic)   Review of Systems Review of Systems 10 Systems reviewed and are negative for acute change except as noted in the HPI.   Physical Exam Updated Vital Signs BP (!) 149/100   Pulse 78   Temp 98 F (36.7 C)   Resp 18   LMP 12/25/2016   SpO2 100%   Physical Exam  Constitutional: She is oriented to person, place, and time.  Alert and nontoxic. No respiratory distress. Morbid obesity. Occasional dry cough.  HENT:  Head: Normocephalic and atraumatic.  Right Ear: External ear normal.  Left Ear: External ear normal.  Nose: Nose normal.  Mouth/Throat: Oropharynx is clear and moist.  Eyes: EOM are normal.  Cardiovascular: Normal rate, regular rhythm, normal heart sounds and intact distal pulses.   Pulmonary/Chest: Effort normal and breath sounds normal.  Dry cough with inspiration. Good air flow to the bases. No active wheeze or rail.  Abdominal: Soft. She exhibits no distension and no mass. There is no tenderness. There is no guarding.  Genitourinary:  Genitourinary Comments: Normal external female genitalia. No significant erythema or any lesions. Mild, thin whitish discharge  in the vaginal vault. Minimal cervical motion tenderness. No tenderness to palpation in bimanual exam of uterus or adnexa.  Musculoskeletal: Normal range of motion. She exhibits no edema or tenderness.  Neurological: She is alert and oriented to person, place, and time. No cranial nerve deficit. She exhibits normal muscle tone. Coordination normal.  Skin: Skin is warm.  Psychiatric: She has a normal mood and affect.     ED Treatments / Results  Labs (all labs ordered are listed, but only abnormal results are displayed) Labs Reviewed  WET PREP, GENITAL - Abnormal; Notable for the following:       Result Value   WBC, Wet Prep HPF POC FEW (*)    All other components within normal limits  URINALYSIS, ROUTINE W REFLEX MICROSCOPIC - Abnormal; Notable for the following:    Leukocytes, UA SMALL (*)    Bacteria, UA RARE (*)    Squamous Epithelial / LPF 6-30 (*)    All other components within normal limits  PREGNANCY, URINE  RPR  HIV ANTIBODY (ROUTINE TESTING)  GC/CHLAMYDIA PROBE AMP ([place]) NOT AT [facility]    EKG  EKG Interpretation None       Radiology Dg Chest 2 View  Result Date: 01/14/2017 CLINICAL DATA:  Productive cough and postnasal drip for 2 weeks. Asthma. EXAM: CHEST  2 VIEW COMPARISON:  None. FINDINGS: The heart size and mediastinal contours are within normal limits. Both lungs are clear. The visualized skeletal structures are unremarkable. IMPRESSION: Negative.  No active cardiopulmonary disease. Electronically Signed   By: Myles Rosenthal M.D.   On: 01/14/2017 12:23    Procedures Procedures (including critical care time)  Medications Ordered in ED Medications  albuterol (PROVENTIL) (2.5 MG/3ML) 0.083% nebulizer solution (not administered)  albuterol (PROVENTIL) (2.5 MG/3ML) 0.083% nebulizer solution 5 mg (5 mg Nebulization Given 01/14/17 1149)     Initial Impression / Assessment and Plan / ED Course  I have reviewed the triage vital signs and the nursing  notes.  Pertinent labs & imaging results that were available during my care of the patient were reviewed by me and considered in my medical decision making (see chart for details).     Final Clinical Impressions(s) / ED Diagnoses   Final diagnoses:  Acute bronchitis, unspecified organism  Mild intermittent asthma with exacerbation  Vaginal discharge   Problem #1, bronchitis symptoms  with asthma. At this time there is no sign of acute pneumonia. Chest x-ray is clear. Patient is afebrile. Patient has associated URI symptoms. Will have patient take 3 days of prednisone and continue albuterol inhaler every 4-6 hours. Also Tessalon Perles as needed for cough paroxysms. Patient's counselor on signs and symptoms which return. Problem #2, vaginal discharge. At this time, urinalysis shows no signs of infection. Patient does not have trichomoniasis, yeast or clue cells in the wet prep. Pelvic exam does not have significant tenderness to palpation of the uterus or the adnexa. At this time this appears to be mild vaginitis of uncertain etiology. Patient advised that gonorrhea and chlamydia results will still be pending. She is counseled to follow-up with her gynecologist for recheck. New Prescriptions New Prescriptions   ALBUTEROL (PROVENTIL HFA;VENTOLIN HFA) 108 (90 BASE) MCG/ACT INHALER    Inhale 1-2 puffs into the lungs every 6 (six) hours as needed for wheezing or shortness of breath.   BENZONATATE (TESSALON) 100 MG CAPSULE    Take 1 capsule (100 mg total) by mouth every 8 (eight) hours.   PREDNISONE (DELTASONE) 20 MG TABLET    2 tabs po daily x 3 days     Arby Barrette, MD 01/14/17 1421

## 2017-01-15 LAB — RPR: RPR Ser Ql: NONREACTIVE

## 2017-01-15 LAB — GC/CHLAMYDIA PROBE AMP (~~LOC~~) NOT AT ARMC
CHLAMYDIA, DNA PROBE: NEGATIVE
NEISSERIA GONORRHEA: NEGATIVE

## 2017-01-15 LAB — HIV ANTIBODY (ROUTINE TESTING W REFLEX): HIV SCREEN 4TH GENERATION: NONREACTIVE

## 2017-01-30 ENCOUNTER — Encounter (INDEPENDENT_AMBULATORY_CARE_PROVIDER_SITE_OTHER): Payer: BLUE CROSS/BLUE SHIELD | Admitting: Family Medicine

## 2017-04-11 ENCOUNTER — Ambulatory Visit: Payer: BLUE CROSS/BLUE SHIELD | Admitting: Women's Health

## 2017-04-11 ENCOUNTER — Encounter: Payer: Self-pay | Admitting: Women's Health

## 2017-04-11 VITALS — BP 120/82

## 2017-04-11 DIAGNOSIS — N938 Other specified abnormal uterine and vaginal bleeding: Secondary | ICD-10-CM | POA: Diagnosis not present

## 2017-04-11 DIAGNOSIS — Z30011 Encounter for initial prescription of contraceptive pills: Secondary | ICD-10-CM

## 2017-04-11 LAB — CBC WITH DIFFERENTIAL/PLATELET
BASOS ABS: 30 {cells}/uL (ref 0–200)
Basophils Relative: 0.6 %
EOS ABS: 150 {cells}/uL (ref 15–500)
EOS PCT: 3 %
HEMATOCRIT: 33 % — AB (ref 35.0–45.0)
HEMOGLOBIN: 10.8 g/dL — AB (ref 11.7–15.5)
Lymphs Abs: 2370 cells/uL (ref 850–3900)
MCH: 26.5 pg — AB (ref 27.0–33.0)
MCHC: 32.7 g/dL (ref 32.0–36.0)
MCV: 80.9 fL (ref 80.0–100.0)
MPV: 10.1 fL (ref 7.5–12.5)
Monocytes Relative: 6 %
NEUTROS ABS: 2150 {cells}/uL (ref 1500–7800)
Neutrophils Relative %: 43 %
Platelets: 282 10*3/uL (ref 140–400)
RBC: 4.08 10*6/uL (ref 3.80–5.10)
RDW: 14.5 % (ref 11.0–15.0)
Total Lymphocyte: 47.4 %
WBC: 5 10*3/uL (ref 3.8–10.8)
WBCMIX: 300 {cells}/uL (ref 200–950)

## 2017-04-11 LAB — TSH: TSH: 2.7 mIU/L

## 2017-04-11 MED ORDER — NORGESTREL-ETHINYL ESTRADIOL 0.3-30 MG-MCG PO TABS: 1.0000 | ORAL_TABLET | Freq: Every day | ORAL | 2 refills | Status: DC

## 2017-04-11 MED ORDER — MEGESTROL ACETATE 40 MG PO TABS: 40.0000 mg | ORAL_TABLET | Freq: Two times a day (BID) | ORAL | 1 refills | Status: DC

## 2017-04-11 NOTE — Progress Notes (Addendum)
26 year old SBF G0 presents with complaint of heavy bleeding for the past month. Cycle started with light flow for 1 week and has had heavy bleeding for the past 3 weeks passing clots, changing pads 6-8 times daily. Not sexually active for greater than 8 months, negative STD screen 12/2016. Denies abdominal pain, urinary symptoms, fever. Had been on Sprintec, stopped April 2018, 7 day cycle after stopping OCs. States  not feeling well on OC's and  not sexually active so stopped. History of irregular cycles with amenorrhea prior to starting OCs. 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  Exam: Appears well, morbidly obese. External genitalia within normal limits, speculum exam copious menses type blood with clots noted, cervix without visible lesion or polyp. No odor noted.  Menorrhagia/DUB  Plan: HCG, CBC, TSH. Megace 40 mg twice daily for 10 days, prescription, proper use given and reviewed. Instructed to call if bleeding does not stop. Options reviewed will try lo Lo/Ovral prescription, proper use, slight risk for blood clots and strokes, start first day or Sunday after starting next cycle, condoms if sexually active encouraged. Instructed to call if continued problems. Discussed importance of decreasing calories/carbs.

## 2017-04-11 NOTE — Patient Instructions (Signed)
Take megace twice for 10day start OCP Sunday after bleeding starts  Dysfunctional Uterine Bleeding Dysfunctional uterine bleeding is abnormal bleeding from the uterus. Dysfunctional uterine bleeding includes:  A period that comes earlier or later than usual.  A period that is lighter, heavier, or has blood clots.  Bleeding between periods.  Skipping one or more periods.  Bleeding after sexual intercourse.  Bleeding after menopause.  Follow these instructions at home: Pay attention to any changes in your symptoms. Follow these instructions to help with your condition: Eating and drinking  Eat well-balanced meals. Include foods that are high in iron, such as liver, meat, shellfish, green leafy vegetables, and eggs.  If you become constipated: ? Drink plenty of water. ? Eat fruits and vegetables that are high in water and fiber, such as spinach, carrots, raspberries, apples, and mango. Medicines  Take over-the-counter and prescription medicines only as told by your health care provider.  Do not change medicines without talking with your health care provider.  Aspirin or medicines that contain aspirin may make the bleeding worse. Do not take those medicines: ? During the week before your period. ? During your period.  If you were prescribed iron pills, take them as told by your health care provider. Iron pills help to replace iron that your body loses because of this condition. Activity  If you need to change your sanitary pad or tampon more than one time every 2 hours: ? Lie in bed with your feet raised (elevated). ? Place a cold pack on your lower abdomen. ? Rest as much as possible until the bleeding stops or slows down.  Do not try to lose weight until the bleeding has stopped and your blood iron level is back to normal. Other Instructions  For two months, write down: ? When your period starts. ? When your period ends. ? When any abnormal bleeding occurs. ? What  problems you notice.  Keep all follow up visits as told by your health care provider. This is important. Contact a health care provider if:  You get light-headed or weak.  You have nausea and vomiting.  You cannot eat or drink without vomiting.  You feel dizzy or have diarrhea while you are taking medicines.  You are taking birth control pills or hormones, and you want to change them or stop taking them. Get help right away if:  You develop a fever or chills.  You need to change your sanitary pad or tampon more than one time per hour.  Your bleeding becomes heavier, or your flow contains clots more often.  You develop pain in your abdomen.  You lose consciousness.  You develop a rash. This information is not intended to replace advice given to you by your health care provider. Make sure you discuss any questions you have with your health care provider. Document Released: 05/05/2000 Document Revised: 10/14/2015 Document Reviewed: 08/03/2014 Elsevier Interactive Patient Education  [employer]2018 [employer]

## 2017-04-11 NOTE — Addendum Note (Signed)
Addended by: Rushie GoltzSPANGLER, Tiaunna Buford on: 04/11/2017 10:51 AM   Modules accepted: Orders

## 2017-04-11 NOTE — Addendum Note (Signed)
Addended by: Harrington ChallengerYOUNG, Malek Skog J on: 04/11/2017 10:51 AM   Modules accepted: Orders

## 2017-04-12 LAB — HCG, SERUM, QUALITATIVE: Preg, Serum: NEGATIVE

## 2017-06-07 ENCOUNTER — Ambulatory Visit (INDEPENDENT_AMBULATORY_CARE_PROVIDER_SITE_OTHER): Payer: BLUE CROSS/BLUE SHIELD | Admitting: Medical

## 2017-06-07 ENCOUNTER — Other Ambulatory Visit (HOSPITAL_COMMUNITY)
Admission: RE | Admit: 2017-06-07 | Discharge: 2017-06-07 | Disposition: A | Discharge status: Home / Self Care | Payer: BLUE CROSS/BLUE SHIELD | Source: Ambulatory Visit | Attending: Medical | Admitting: Medical

## 2017-06-07 ENCOUNTER — Telehealth: Payer: Self-pay | Admitting: Medical

## 2017-06-07 ENCOUNTER — Encounter: Payer: Self-pay | Admitting: Medical

## 2017-06-07 VITALS — BP 130/70 | HR 80 | Temp 98.2°F | Resp 16 | Ht 67.0 in | Wt 363.0 lb

## 2017-06-07 DIAGNOSIS — N63 Unspecified lump in unspecified breast: Secondary | ICD-10-CM

## 2017-06-07 DIAGNOSIS — Z113 Encounter for screening for infections with a predominantly sexual mode of transmission: Secondary | ICD-10-CM

## 2017-06-07 DIAGNOSIS — N938 Other specified abnormal uterine and vaginal bleeding: Secondary | ICD-10-CM

## 2017-06-07 NOTE — Patient Instructions (Signed)
We will send your urine ancillary test out for screening STDs.  Also, I want you to go to our lab today and will have RPR blood test drawn.  For your dysfunctional uterine bleeding, I am going to send a message request to the NP who saw you and treated your dysfunctional bleeding.  You have an appointment with her on June 26, 2017 but I will see if she wants to make a change to your treatment prior to the office visit.  If I get word from her I will let you know.  For your right side breast lump, I am going to schedule you for imaging studies to be done at the breast center.  You should be getting word of scheduling by middle of next week.  If no one calls you by Wednesday that I want you to call here and asked to speak with the plan or Jasmine.  If you have my chart then you could send me a direct message as well.  Follow-up date to be determined after imaging review or as needed.

## 2017-06-07 NOTE — Progress Notes (Signed)
Subjective:    Patient ID: Brooke Russell, female    DOB: 09-23-1990, 27 y.o.   MRN: [id number]  HPI  Pt in for evaluation. She updates me that she is getting her lpn in May.  Pt has seen Maryelizabeth RowanNancy Young NP for dysfunctional uterine bleeding. She placed pt on megace 40 mg bid x 10 days and lo/ovral rx. Pt is bleeding a lot less(about 75% less). She thinks may need stronger ocp. Pt will see Maryelizabeth RowanNancy Young NP in June 26, 2017   Pt does want some std testing.States screening.But no discharge. No vaginal lesions. No known std exposure.  But she does decline HIV testing.  Pt rt breast has been sore since Sunday. Pt never had occasional breast pain before. No nipple dc. Left feels normal. Pt explains and points out pain lateral breast area near axillary area. Pt thinks she felt maybe small lump. Mother of pt felt area but did not feel lump. Great grandmom had breast cancer maybe mid 26 yo. Pt grandmother and mother had no breast cancer. No other relatives have breast ca as well. No caffeine beverage and she does not smoke.     Review of Systems  Constitutional: Negative for chills, fatigue and fever.  Respiratory: Negative for cough, choking and shortness of breath.   Cardiovascular: Negative for chest pain and palpitations.  Gastrointestinal: Negative for abdominal pain, constipation, nausea and vomiting.  Genitourinary: Positive for menstrual problem. Negative for dyspareunia, flank pain, frequency, genital sores, pelvic pain, urgency and vaginal pain.       See hpi.  Musculoskeletal: Negative for back pain, myalgias and neck stiffness.       Possible breast lump.  Skin: Negative for rash.  Neurological: Negative for dizziness, weakness, light-headedness, numbness and headaches.  Hematological: Negative for adenopathy. Does not bruise/bleed easily.  Psychiatric/Behavioral: Negative for behavioral problems and confusion. The patient is not nervous/anxious.     Past Medical History:    Diagnosis Date  . Allergy    spring.  . Asthma   . History of chicken pox   . Irregular menses      Social History   Socioeconomic History  . Marital status: Single    Spouse name: Not on file  . Number of children: Not on file  . Years of education: Not on file  . Highest education level: Not on file  Social Needs  . Financial resource strain: Not on file  . Food insecurity - worry: Not on file  . Food insecurity - inability: Not on file  . Transportation needs - medical: Not on file  . Transportation needs - non-medical: Not on file  Occupational History  . Not on file  Tobacco Use  . Smoking status: Never Smoker  . Smokeless tobacco: Never Used  Substance and Sexual Activity  . Alcohol use: Yes    Comment: very rare  . Drug use: No  . Sexual activity: Yes    Birth control/protection: Abstinence    Comment: 1ST INTERCOURSE- 6123, PARTNERS- 1 . in past active but presently abstinence.  Other Topics Concern  . Not on file  Social History Narrative  . Not on file    Past Surgical History:  Procedure Laterality Date  . NO PAST SURGERIES      Family History  Problem Relation Age of Onset  . Hypertension Mother   . Healthy Father   . Healthy Sister   . Healthy Brother   . Diabetes Maternal Grandmother  MGGM  . Ovarian cancer Maternal Grandmother        MGGM  . Alcohol abuse Neg Hx   . Arthritis Neg Hx   . Asthma Neg Hx   . Birth defects Neg Hx   . Cancer Neg Hx   . COPD Neg Hx   . Depression Neg Hx   . Drug abuse Neg Hx   . Early death Neg Hx   . Hearing loss Neg Hx   . Heart disease Neg Hx   . Hyperlipidemia Neg Hx   . Kidney disease Neg Hx   . Learning disabilities Neg Hx   . Mental illness Neg Hx   . Mental retardation Neg Hx   . Miscarriages / Stillbirths Neg Hx   . Stroke Neg Hx   . Vision loss Neg Hx   . Varicose Veins Neg Hx     Allergies  Allergen Reactions  . Almond (Diagnostic) Nausea And Vomiting    Almond Milk    Current  Outpatient Medications on File Prior to Visit  Medication Sig Dispense Refill  . albuterol (PROVENTIL HFA;VENTOLIN HFA) 108 (90 Base) MCG/ACT inhaler Inhale 1-2 puffs into the lungs every 6 (six) hours as needed for wheezing or shortness of breath. 1 Inhaler 0  . norgestrel-ethinyl estradiol (LO/OVRAL,CRYSELLE) 0.3-30 MG-MCG tablet Take 1 tablet by mouth daily. 13 Package 2   No current facility-administered medications on file prior to visit.     BP (!) 145/78 (BP Location: Right Arm, Patient Position: Sitting, Cuff Size: Large)   Pulse 80   Temp 98.2 F (36.8 C)   Resp 16   Ht 5\' 7"  (1.702 m)   Wt (!) 363 lb (164.7 kg)   SpO2 99%   BMI 56.85 kg/m       Objective:   Physical Exam  General- No acute distress. Pleasant patient. Neck- Full range of motion, no jvd Lungs- Clear, even and unlabored. Heart- regular rate and rhythm. Neurologic- CNII- XII grossly intact.  Abdomen- soft, nt, nd, +bs, no suprapubic tender or adnexal mass Back- no cva tendereness.  Breast- symmetric, very large breasts that appear symmetric, no nipple dc, no inversion. No discoloration. Rt lateral aspect of breast/adjacent to axillary area shows possible small lump/mass 2.0 cm diameter. On exam axillary both sides no lymphadenopathy      Assessment & Plan:  We will send your urine ancillary test out for screening STDs.  Also, I want you to go to our lab today and will have RPR blood test drawn.  For your dysfunctional uterine bleeding, I am going to send a message request to the NP who saw you and treated your dysfunctional bleeding.  You have an appointment with her on June 26, 2017 but I will see if she wants to make a change to your treatment prior to the office visit.  If I get word from her I will let you know.  For your right side breast lump, I am going to schedule you for imaging studies to be done at the breast center.  You should be getting word of scheduling by middle of next week.  If no  one calls you by Wednesday that I want you to call here and asked to speak with the plan or Jasmine.  If you have my chart then you could send me a direct message as well.  Follow-up date to be determined after imaging review or as needed.  Hamdi Vari, Ramon Dredge, PA-C

## 2017-06-07 NOTE — Telephone Encounter (Signed)
Patient is following up with you on February 5 for dysfunctional uterine bleeding.  She reports her bleeding has decreased by about 75% since starting lo/ovral.  She thinks she might need different OCP to resolve the bleeding completely.  She mentioned that she has appointment with you on June 26, 2017.  Patient seems willing to wait until then but I did mention that I would pass along her concern.  Thanks for your help with our patients

## 2017-06-11 LAB — URINE CYTOLOGY ANCILLARY ONLY
Chlamydia: NEGATIVE
Neisseria Gonorrhea: NEGATIVE
Trichomonas: NEGATIVE

## 2017-06-12 LAB — RPR: RPR: NONREACTIVE

## 2017-06-13 ENCOUNTER — Encounter: Payer: Self-pay | Admitting: Medical

## 2017-06-13 ENCOUNTER — Telehealth: Payer: Self-pay | Admitting: Medical

## 2017-06-13 DIAGNOSIS — Z1239 Encounter for other screening for malignant neoplasm of breast: Secondary | ICD-10-CM

## 2017-06-13 LAB — URINE CYTOLOGY ANCILLARY ONLY: Candida vaginitis: NEGATIVE

## 2017-06-13 MED ORDER — METRONIDAZOLE 500 MG PO TABS: 500.0000 mg | ORAL_TABLET | Freq: Two times a day (BID) | ORAL | 0 refills | Status: DC

## 2017-06-13 NOTE — Telephone Encounter (Signed)
Prescription of Flagyl sent to pharmacy for bacterial vaginosis

## 2017-06-14 NOTE — Telephone Encounter (Signed)
TC being treated for BV per primary care, will watch bleeding for now, currently very ligjht

## 2017-06-14 NOTE — Telephone Encounter (Signed)
Called Pt and she stated that no one had called er for imaging, upon looking I noticed that the Ref had not been put in. I placed ref and made sure Pt was aware of all results from testing from last visit. Pt was pleased and I advised her that if she hadn't heard anything by Wednesday of next week to give our office a call back.

## 2017-06-14 NOTE — Telephone Encounter (Signed)
-----   Message from Esperanza RichtersEdward Saguier, PA-C sent at 06/12/2017 11:00 AM EST ----- rpr blood test negative.

## 2017-06-14 NOTE — Telephone Encounter (Signed)
Message left

## 2017-06-25 ENCOUNTER — Ambulatory Visit
Admission: RE | Admit: 2017-06-25 | Discharge: 2017-06-25 | Disposition: A | Discharge status: Home / Self Care | Payer: BLUE CROSS/BLUE SHIELD | Source: Ambulatory Visit | Attending: Medical | Admitting: Medical

## 2017-06-25 DIAGNOSIS — N63 Unspecified lump in unspecified breast: Secondary | ICD-10-CM

## 2017-06-26 ENCOUNTER — Encounter: Payer: Self-pay | Admitting: Women's Health

## 2017-06-26 ENCOUNTER — Ambulatory Visit (INDEPENDENT_AMBULATORY_CARE_PROVIDER_SITE_OTHER): Payer: BLUE CROSS/BLUE SHIELD | Admitting: Women's Health

## 2017-06-26 VITALS — BP 132/78 | Ht 67.0 in | Wt 360.0 lb

## 2017-06-26 DIAGNOSIS — Z30011 Encounter for initial prescription of contraceptive pills: Secondary | ICD-10-CM | POA: Diagnosis not present

## 2017-06-26 DIAGNOSIS — Z01419 Encounter for gynecological examination (general) (routine) without abnormal findings: Secondary | ICD-10-CM | POA: Diagnosis not present

## 2017-06-26 MED ORDER — NORGESTREL-ETHINYL ESTRADIOL 0.3-30 MG-MCG PO TABS: 1.0000 | ORAL_TABLET | Freq: Every day | ORAL | 4 refills | Status: DC

## 2017-06-26 NOTE — Progress Notes (Signed)
Brooke Russell Nov 12, 1990 [id number]    History:    Presents for annual exam. History Brooke Capriceof irregular bleeding on OCs has done better on Lo/Ovral/ 75% improvement. Has had some spotting this month but upon questioning stopped the Lo/Ovral when treated for BV from primary care/drug insert said "do not take with other medications". Not sexually active greater than a month. Gardasil series completed. Negative STD screen last month. Normal Pap history.  Past medical history, past surgical history, family history and social history were all reviewed and documented in the EPIC chart. 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 Father recently diagnosed with type 2 diabetes. Mother healthy.  ROS:  A ROS was performed and pertinent positives and negatives are included.  Exam:  Vitals:   06/26/17 1215  BP: 132/78  Weight: (!) 360 lb (163.3 kg)  Height: 5\' 7"  (1.702 m)   Body mass index is 56.38 kg/m.   General appearance:  Normal Thyroid:  Symmetrical, normal in size, without palpable masses or nodularity. Respiratory  Auscultation:  Clear without wheezing or rhonchi Cardiovascular  Auscultation:  Regular rate, without rubs, murmurs or gallops  Edema/varicosities:  Not grossly evident Abdominal  Soft,nontender, without masses, guarding or rebound.  Liver/spleen:  No organomegaly noted  Hernia:  None appreciated  Skin  Inspection:  Grossly normal   Breasts: Examined lying and sitting.     Right: Without masses, retractions, discharge or axillary adenopathy.     Left: Without masses, retractions, discharge or axillary adenopathy. Gentitourinary   Inguinal/mons:  Normal without inguinal adenopathy  External genitalia:  Normal  BUS/Urethra/Skene's glands:  Normal  Vagina:  Normal  Cervix:  Normal  Uterus:  normal in size, shape and contour.  Midline and mobile  Adnexa/parametria:     Rt: Without masses or tenderness.   Lt: Without masses or tenderness.  Anus and  perineum: Normal  Digital rectal exam: Normal sphincter tone without palpated masses or tenderness  Assessment/Plan:  27 y.o. SBF G0 for annual exam with no complaints.  Monthly cycle on Lo/Ovral Morbid obesity Labs-primary care  Plan: Lo/Ovral prescription, proper use, slight risk for blood clots and strokes reviewed. Start up instructions reviewed. Reviewed importance of condoms until permanent partner. SBE's, increase exercise and decrease calorie/carbs for weight loss encouraged. MVI daily encouraged. Pap normal 2018, new screening guidelines reviewed.    Harrington Challengerancy J Brick Ketcher [facility] CenterWHNP, 12:54 PM 06/26/2017

## 2017-06-26 NOTE — Patient Instructions (Signed)
Health Maintenance, Female Adopting a healthy lifestyle and getting preventive care can go a long way to promote health and wellness. Talk with your health care provider about what schedule of regular examinations is right for you. This is a good chance for you to check in with your provider about disease prevention and staying healthy. In between checkups, there are plenty of things you can do on your own. Experts have done a lot of research about which lifestyle changes and preventive measures are most likely to keep you healthy. Ask your health care provider for more information. Weight and diet Eat a healthy diet  Be sure to include plenty of vegetables, fruits, low-fat dairy products, and lean protein.  Do not eat a lot of foods high in solid fats, added sugars, or salt.  Get regular exercise. This is one of the most important things you can do for your health. ? Most adults should exercise for at least 150 minutes each week. The exercise should increase your heart rate and make you sweat (moderate-intensity exercise). ? Most adults should also do strengthening exercises at least twice a week. This is in addition to the moderate-intensity exercise.  Maintain a healthy weight  Body mass index (BMI) is a measurement that can be used to identify possible weight problems. It estimates body fat based on height and weight. Your health care provider can help determine your BMI and help you achieve or maintain a healthy weight.  For females 20 years of age and older: ? A BMI below 18.5 is considered underweight. ? A BMI of 18.5 to 24.9 is normal. ? A BMI of 25 to 29.9 is considered overweight. ? A BMI of 30 and above is considered obese.  Watch levels of cholesterol and blood lipids  You should start having your blood tested for lipids and cholesterol at 26 years of age, then have this test every 5 years.  You may need to have your cholesterol levels checked more often if: ? Your lipid or  cholesterol levels are high. ? You are older than 26 years of age. ? You are at high risk for heart disease.  Cancer screening Lung Cancer  Lung cancer screening is recommended for adults 55-80 years old who are at high risk for lung cancer because of a history of smoking.  A yearly low-dose CT scan of the lungs is recommended for people who: ? Currently smoke. ? Have quit within the past 15 years. ? Have at least a 30-pack-year history of smoking. A pack year is smoking an average of one pack of cigarettes a day for 1 year.  Yearly screening should continue until it has been 15 years since you quit.  Yearly screening should stop if you develop a health problem that would prevent you from having lung cancer treatment.  Breast Cancer  Practice breast self-awareness. This means understanding how your breasts normally appear and feel.  It also means doing regular breast self-exams. Let your health care provider know about any changes, no matter how small.  If you are in your 20s or 30s, you should have a clinical breast exam (CBE) by a health care provider every 1-3 years as part of a regular health exam.  If you are 40 or older, have a CBE every year. Also consider having a breast X-ray (mammogram) every year.  If you have a family history of breast cancer, talk to your health care provider about genetic screening.  If you are at high risk   for breast cancer, talk to your health care provider about having an MRI and a mammogram every year.  Breast cancer gene (BRCA) assessment is recommended for women who have family members with BRCA-related cancers. BRCA-related cancers include: ? Breast. ? Ovarian. ? Tubal. ? Peritoneal cancers.  Results of the assessment will determine the need for genetic counseling and BRCA1 and BRCA2 testing.  Cervical Cancer Your health care provider may recommend that you be screened regularly for cancer of the pelvic organs (ovaries, uterus, and  vagina). This screening involves a pelvic examination, including checking for microscopic changes to the surface of your cervix (Pap test). You may be encouraged to have this screening done every 3 years, beginning at age 22.  For women ages 56-65, health care providers may recommend pelvic exams and Pap testing every 3 years, or they may recommend the Pap and pelvic exam, combined with testing for human papilloma virus (HPV), every 5 years. Some types of HPV increase your risk of cervical cancer. Testing for HPV may also be done on women of any age with unclear Pap test results.  Other health care providers may not recommend any screening for nonpregnant women who are considered low risk for pelvic cancer and who do not have symptoms. Ask your health care provider if a screening pelvic exam is right for you.  If you have had past treatment for cervical cancer or a condition that could lead to cancer, you need Pap tests and screening for cancer for at least 20 years after your treatment. If Pap tests have been discontinued, your risk factors (such as having a new sexual partner) need to be reassessed to determine if screening should resume. Some women have medical problems that increase the chance of getting cervical cancer. In these cases, your health care provider may recommend more frequent screening and Pap tests.  Colorectal Cancer  This type of cancer can be detected and often prevented.  Routine colorectal cancer screening usually begins at 26 years of age and continues through 26 years of age.  Your health care provider may recommend screening at an earlier age if you have risk factors for colon cancer.  Your health care provider may also recommend using home test kits to check for hidden blood in the stool.  A small camera at the end of a tube can be used to examine your colon directly (sigmoidoscopy or colonoscopy). This is done to check for the earliest forms of colorectal  cancer.  Routine screening usually begins at age 33.  Direct examination of the colon should be repeated every 5-10 years through 26 years of age. However, you may need to be screened more often if early forms of precancerous polyps or small growths are found.  Skin Cancer  Check your skin from head to toe regularly.  Tell your health care provider about any new moles or changes in moles, especially if there is a change in a mole's shape or color.  Also tell your health care provider if you have a mole that is larger than the size of a pencil eraser.  Always use sunscreen. Apply sunscreen liberally and repeatedly throughout the day.  Protect yourself by wearing long sleeves, pants, a wide-brimmed hat, and sunglasses whenever you are outside.  Heart disease, diabetes, and high blood pressure  High blood pressure causes heart disease and increases the risk of stroke. High blood pressure is more likely to develop in: ? People who have blood pressure in the high end of  the normal range (130-139/85-89 mm Hg). ? People who are overweight or obese. ? People who are African American.  If you are 21-29 years of age, have your blood pressure checked every 3-5 years. If you are 3 years of age or older, have your blood pressure checked every year. You should have your blood pressure measured twice-once when you are at a hospital or clinic, and once when you are not at a hospital or clinic. Record the average of the two measurements. To check your blood pressure when you are not at a hospital or clinic, you can use: ? An automated blood pressure machine at a pharmacy. ? A home blood pressure monitor.  If you are between 17 years and 37 years old, ask your health care provider if you should take aspirin to prevent strokes.  Have regular diabetes screenings. This involves taking a blood sample to check your fasting blood sugar level. ? If you are at a normal weight and have a low risk for diabetes,  have this test once every three years after 26 years of age. ? If you are overweight and have a high risk for diabetes, consider being tested at a younger age or more often. Preventing infection Hepatitis B  If you have a higher risk for hepatitis B, you should be screened for this virus. You are considered at high risk for hepatitis B if: ? You were born in a country where hepatitis B is common. Ask your health care provider which countries are considered high risk. ? Your parents were born in a high-risk country, and you have not been immunized against hepatitis B (hepatitis B vaccine). ? You have HIV or AIDS. ? You use needles to inject street drugs. ? You live with someone who has hepatitis B. ? You have had sex with someone who has hepatitis B. ? You get hemodialysis treatment. ? You take certain medicines for conditions, including cancer, organ transplantation, and autoimmune conditions.  Hepatitis C  Blood testing is recommended for: ? Everyone born from [birth year] through 1964. ? Anyone with known risk factors for hepatitis C.  Sexually transmitted infections (STIs)  You should be screened for sexually transmitted infections (STIs) including gonorrhea and chlamydia if: ? You are sexually active and are younger than 26 years of age. ? You are older than 26 years of age and your health care provider tells you that you are at risk for this type of infection. ? Your sexual activity has changed since you were last screened and you are at an increased risk for chlamydia or gonorrhea. Ask your health care provider if you are at risk.  If you do not have HIV, but are at risk, it may be recommended that you take a prescription medicine daily to prevent HIV infection. This is called pre-exposure prophylaxis (PrEP). You are considered at risk if: ? You are sexually active and do not regularly use condoms or know the HIV status of your partner(s). ? You take drugs by injection. ? You are  sexually active with a partner who has HIV.  Talk with your health care provider about whether you are at high risk of being infected with HIV. If you choose to begin PrEP, you should first be tested for HIV. You should then be tested every 3 months for as long as you are taking PrEP. Pregnancy  If you are premenopausal and you may become pregnant, ask your health care provider about preconception counseling.  If you may become  pregnant, take 400 to 800 micrograms (mcg) of folic acid every day.  If you want to prevent pregnancy, talk to your health care provider about birth control (contraception). Osteoporosis and menopause  Osteoporosis is a disease in which the bones lose minerals and strength with aging. This can result in serious bone fractures. Your risk for osteoporosis can be identified using a bone density scan.  If you are 65 years of age or older, or if you are at risk for osteoporosis and fractures, ask your health care provider if you should be screened.  Ask your health care provider whether you should take a calcium or vitamin D supplement to lower your risk for osteoporosis.  Menopause may have certain physical symptoms and risks.  Hormone replacement therapy may reduce some of these symptoms and risks. Talk to your health care provider about whether hormone replacement therapy is right for you. Follow these instructions at home:  Schedule regular health, dental, and eye exams.  Stay current with your immunizations.  Do not use any tobacco products including cigarettes, chewing tobacco, or electronic cigarettes.  If you are pregnant, do not drink alcohol.  If you are breastfeeding, limit how much and how often you drink alcohol.  Limit alcohol intake to no more than 1 drink per day for nonpregnant women. One drink equals 12 ounces of beer, 5 ounces of wine, or 1 ounces of hard liquor.  Do not use street drugs.  Do not share needles.  Ask your health care  provider for help if you need support or information about quitting drugs.  Tell your health care provider if you often feel depressed.  Tell your health care provider if you have ever been abused or do not feel safe at home. This information is not intended to replace advice given to you by your health care provider. Make sure you discuss any questions you have with your health care provider. Document Released: 11/21/2010 Document Revised: 10/14/2015 Document Reviewed: 02/09/2015 Elsevier Interactive Patient Education  2018 [employer] Carbohydrate Counting for Diabetes Mellitus, Adult Carbohydrate counting is a method for keeping track of how many carbohydrates you eat. Eating carbohydrates naturally increases the amount of sugar (glucose) in the blood. Counting how many carbohydrates you eat helps keep your blood glucose within normal limits, which helps you manage your diabetes (diabetes mellitus). It is important to know how many carbohydrates you can safely have in each meal. This is different for every person. A diet and nutrition specialist (registered dietitian) can help you make a meal plan and calculate how many carbohydrates you should have at each meal and snack. Carbohydrates are found in the following foods:  Grains, such as breads and cereals.  Dried beans and soy products.  Starchy vegetables, such as potatoes, peas, and corn.  Fruit and fruit juices.  Milk and yogurt.  Sweets and snack foods, such as cake, cookies, candy, chips, and soft drinks.  How do I count carbohydrates? There are two ways to count carbohydrates in food. You can use either of the methods or a combination of both. Reading "Nutrition Facts" on packaged food The "Nutrition Facts" list is included on the labels of almost all packaged foods and beverages in the [place] It includes:  The serving size.  Information about nutrients in each serving, including the grams (g) of carbohydrate per  serving.  To use the "Nutrition Facts":  Decide how many servings you will have.  Multiply the number of servings by the number of carbohydrates   per serving.  The resulting number is the total amount of carbohydrates that you will be having.  Learning standard serving sizes of other foods When you eat foods containing carbohydrates that are not packaged or do not include "Nutrition Facts" on the label, you need to measure the servings in order to count the amount of carbohydrates:  Measure the foods that you will eat with a food scale or measuring cup, if needed.  Decide how many standard-size servings you will eat.  Multiply the number of servings by 15. Most carbohydrate-rich foods have about 15 g of carbohydrates per serving. ? For example, if you eat 8 oz (170 g) of strawberries, you will have eaten 2 servings and 30 g of carbohydrates (2 servings x 15 g = 30 g).  For foods that have more than one food mixed, such as soups and casseroles, you must count the carbohydrates in each food that is included.  The following list contains standard serving sizes of common carbohydrate-rich foods. Each of these servings has about 15 g of carbohydrates:   hamburger bun or  English muffin.   oz (15 mL) syrup.   oz (14 g) jelly.  1 slice of bread.  1 six-inch tortilla.  3 oz (85 g) cooked rice or pasta.  4 oz (113 g) cooked dried beans.  4 oz (113 g) starchy vegetable, such as peas, corn, or potatoes.  4 oz (113 g) hot cereal.  4 oz (113 g) mashed potatoes or  of a large baked potato.  4 oz (113 g) canned or frozen fruit.  4 oz (120 mL) fruit juice.  4-6 crackers.  6 chicken nuggets.  6 oz (170 g) unsweetened dry cereal.  6 oz (170 g) plain fat-free yogurt or yogurt sweetened with artificial sweeteners.  8 oz (240 mL) milk.  8 oz (170 g) fresh fruit or one small piece of fruit.  24 oz (680 g) popped popcorn.  Example of carbohydrate counting Sample meal  3  oz (85 g) chicken breast.  6 oz (170 g) brown rice.  4 oz (113 g) corn.  8 oz (240 mL) milk.  8 oz (170 g) strawberries with sugar-free whipped topping. Carbohydrate calculation 1. Identify the foods that contain carbohydrates: ? Rice. ? Corn. ? Milk. ? Strawberries. 2. Calculate how many servings you have of each food: ? 2 servings rice. ? 1 serving corn. ? 1 serving milk. ? 1 serving strawberries. 3. Multiply each number of servings by 15 g: ? 2 servings rice x 15 g = 30 g. ? 1 serving corn x 15 g = 15 g. ? 1 serving milk x 15 g = 15 g. ? 1 serving strawberries x 15 g = 15 g. 4. Add together all of the amounts to find the total grams of carbohydrates eaten: ? 30 g + 15 g + 15 g + 15 g = 75 g of carbohydrates total. This information is not intended to replace advice given to you by your health care provider. Make sure you discuss any questions you have with your health care provider. Document Released: 05/08/2005 Document Revised: 11/26/2015 Document Reviewed: 10/20/2015 Elsevier Interactive Patient Education  [employer].

## 2017-07-09 ENCOUNTER — Ambulatory Visit: Payer: BLUE CROSS/BLUE SHIELD | Admitting: Medical

## 2017-07-09 ENCOUNTER — Ambulatory Visit (HOSPITAL_BASED_OUTPATIENT_CLINIC_OR_DEPARTMENT_OTHER)
Admission: RE | Admit: 2017-07-09 | Discharge: 2017-07-09 | Disposition: A | Discharge status: Home / Self Care | Payer: BLUE CROSS/BLUE SHIELD | Source: Ambulatory Visit | Attending: Medical | Admitting: Medical

## 2017-07-09 ENCOUNTER — Encounter: Payer: Self-pay | Admitting: Medical

## 2017-07-09 VITALS — BP 126/92 | HR 116 | Temp 99.3°F | Resp 16 | Ht 67.0 in | Wt 348.6 lb

## 2017-07-09 DIAGNOSIS — R509 Fever, unspecified: Secondary | ICD-10-CM | POA: Insufficient documentation

## 2017-07-09 DIAGNOSIS — J181 Lobar pneumonia, unspecified organism: Secondary | ICD-10-CM

## 2017-07-09 DIAGNOSIS — R059 Cough, unspecified: Secondary | ICD-10-CM

## 2017-07-09 DIAGNOSIS — R05 Cough: Secondary | ICD-10-CM | POA: Insufficient documentation

## 2017-07-09 DIAGNOSIS — J189 Pneumonia, unspecified organism: Secondary | ICD-10-CM

## 2017-07-09 DIAGNOSIS — J111 Influenza due to unidentified influenza virus with other respiratory manifestations: Secondary | ICD-10-CM | POA: Diagnosis not present

## 2017-07-09 MED ORDER — ALBUTEROL SULFATE HFA 108 (90 BASE) MCG/ACT IN AERS: 2.0000 | INHALATION_SPRAY | Freq: Four times a day (QID) | RESPIRATORY_TRACT | 0 refills | Status: DC | PRN

## 2017-07-09 MED ORDER — BENZONATATE 200 MG PO CAPS: 200.0000 mg | ORAL_CAPSULE | Freq: Three times a day (TID) | ORAL | 0 refills | Status: DC | PRN

## 2017-07-09 MED ORDER — FLUTICASONE PROPIONATE 50 MCG/ACT NA SUSP: 2.0000 | Freq: Every day | NASAL | 1 refills | Status: DC

## 2017-07-09 MED ORDER — OSELTAMIVIR PHOSPHATE 75 MG PO CAPS: 75.0000 mg | ORAL_CAPSULE | Freq: Two times a day (BID) | ORAL | 0 refills | Status: DC

## 2017-07-09 MED ORDER — DOXYCYCLINE HYCLATE 100 MG PO TABS: 100.0000 mg | ORAL_TABLET | Freq: Two times a day (BID) | ORAL | 0 refills | Status: DC

## 2017-07-09 MED ORDER — AZITHROMYCIN 250 MG PO TABS: ORAL_TABLET | ORAL | 0 refills | Status: DC

## 2017-07-09 NOTE — Progress Notes (Signed)
Subjective:    Patient ID: Brooke Russell, female    DOB: April 30, 1991, 27 y.o.   MRN: [id number]  HPI  Pt in with recent illness. Cough on Friday night. Then next morning diffuse boy aches, fever, and chills. Some ha yesterday. Also st, ear aches and chest congestion.  She has some productive cough with blood tinged mucus.  LMP- 06/26/2017.   Review of Systems  Constitutional: Positive for fatigue and fever. Negative for chills.  Respiratory: Positive for cough and wheezing. Negative for shortness of breath.        Some mild wheezing recently.  Cardiovascular: Negative for chest pain and palpitations.  Gastrointestinal: Negative for abdominal distention, abdominal pain, constipation and vomiting.  Musculoskeletal: Positive for myalgias.  Skin: Negative for rash.  Hematological: Negative for adenopathy. Does not bruise/bleed easily.  Psychiatric/Behavioral: Negative for confusion.    Past Medical History:  Diagnosis Date  . Allergy    spring.  . Asthma   . History of chicken pox   . Irregular menses      Social History   Socioeconomic History  . Marital status: Single    Spouse name: Not on file  . Number of children: Not on file  . Years of education: Not on file  . Highest education level: Not on file  Social Needs  . Financial resource strain: Not on file  . Food insecurity - worry: Not on file  . Food insecurity - inability: Not on file  . Transportation needs - medical: Not on file  . Transportation needs - non-medical: Not on file  Occupational History  . Not on file  Tobacco Use  . Smoking status: Never Smoker  . Smokeless tobacco: Never Used  Substance and Sexual Activity  . Alcohol use: Yes    Comment: very rare  . Drug use: No  . Sexual activity: Yes    Birth control/protection: Abstinence    Comment: 1ST INTERCOURSE- 6323, PARTNERS- 1 . in past active but presently abstinence.  Other Topics Concern  . Not on file  Social History Narrative  . Not on  file    Past Surgical History:  Procedure Laterality Date  . NO PAST SURGERIES      Family History  Problem Relation Age of Onset  . Hypertension Mother   . Healthy Father   . Healthy Sister   . Healthy Brother   . Diabetes Maternal Grandmother        MGGM  . Ovarian cancer Maternal Grandmother        MGGM  . Alcohol abuse Neg Hx   . Arthritis Neg Hx   . Asthma Neg Hx   . Birth defects Neg Hx   . Cancer Neg Hx   . COPD Neg Hx   . Depression Neg Hx   . Drug abuse Neg Hx   . Early death Neg Hx   . Hearing loss Neg Hx   . Heart disease Neg Hx   . Hyperlipidemia Neg Hx   . Kidney disease Neg Hx   . Learning disabilities Neg Hx   . Mental illness Neg Hx   . Mental retardation Neg Hx   . Miscarriages / Stillbirths Neg Hx   . Stroke Neg Hx   . Vision loss Neg Hx   . Varicose Veins Neg Hx     Allergies  Allergen Reactions  . Almond (Diagnostic) Nausea And Vomiting    Almond Milk    Current Outpatient Medications on File Prior to  Visit  Medication Sig Dispense Refill  . norgestrel-ethinyl estradiol (LO/OVRAL,CRYSELLE) 0.3-30 MG-MCG tablet Take 1 tablet by mouth daily. 3 Package 4   No current facility-administered medications on file prior to visit.     BP (!) 126/92   Pulse (!) 116   Temp 99.3 F (37.4 C) (Oral)   Resp 16   Ht 5\' 7"  (1.702 m)   Wt (!) 348 lb 9.6 oz (158.1 kg)   LMP 06/18/2017   SpO2 98%   BMI 54.60 kg/m       Objective:   Physical Exam  General  Mental Status - Alert. General Appearance - Well groomed. Not in acute distress.  But appears fatigued and has moderate to severe cough during exam.  Also sounds very nasal congested.  Skin Rashes- No Rashes.  HEENT Head- Normal. Ear Auditory Canal - Left- Normal. Right - Normal.Tympanic Membrane- Left- Normal. Right- Normal. Eye Sclera/Conjunctiva- Left- Normal. Right- Normal. Nose & Sinuses Nasal Mucosa- Left-  Boggy and Congested. Right-  Boggy and  Congested.Bilateral maxillary and  frontal sinus pressure. Mouth & Throat Lips: Upper Lip- Normal: no dryness, cracking, pallor, cyanosis, or vesicular eruption. Lower Lip-Normal: no dryness, cracking, pallor, cyanosis or vesicular eruption. Buccal Mucosa- Bilateral- No Aphthous ulcers. Oropharynx- No Discharge or Erythema. Tonsils: Characteristics- Bilateral- No Erythema or Congestion. Size/Enlargement- Bilateral- No enlargement. Discharge- bilateral-None.  Neck Neck- Supple. No Masses.   Chest and Lung Exam Auscultation: Breath Sounds:-Clear even and unlabored.  Cardiovascular Auscultation:Rythm- Regular, rate and rhythm. Murmurs & Other Heart Sounds:Ausculatation of the heart reveal- No Murmurs.  Lymphatic Head & Neck General Head & Neck Lymphatics: Bilateral: Description- No Localized lymphadenopathy.       Assessment & Plan:  You do appear to have suspicious flulike syndrome with exposure to 2 relatives with the flu recently.  Your flu test came back negative but I have suspicion that test may be falsely negative.  Recommend starting Tamiflu today.  Some concern for secondary infection/bronchitis.  I am prescribing a azithromycin antibiotic.  For cough, I am prescribing benzonatate.  For nasal congestion I prescribed Flonase.  Please get chest x-ray today.  I want to evaluate/make sure no pneumonia present.  After reviewing cxr/pneumonia finding advised start doxycycline. Sent that to pharmacy. Canceled the azithromycin antibiotic. Called pt pharmacy.  Mild wheezing recently.  Refill patient's albuterol prescription.  If having to use albuterol frequently then asked patient to notify me.  In that event would add taper course of prednisone.  Esperanza Richters, PA-C

## 2017-07-09 NOTE — Patient Instructions (Addendum)
You do appear to have suspicious flulike syndrome with exposure to 2 relatives with the flu recently.  Your flu test came back negative but I have suspicion that test may be falsely negative.  Recommend starting Tamiflu today.  Some concern for secondary infection/bronchitis.  I am prescribing a azithromycin antibiotic.  For cough, I am prescribing benzonatate.  For nasal congestion I prescribed Flonase.  Please get chest x-ray today.  I want to evaluate/make sure no pneumonia present.  Follow-up in 7 days or as needed.  After reviewing cxr/pneumonia finding advised start doxycycline. Sent that to pharmacy. Canceled the azithromycin antibiotic.

## 2017-07-10 ENCOUNTER — Telehealth: Payer: Self-pay | Admitting: Medical

## 2017-07-10 MED ORDER — HYDROCODONE-HOMATROPINE 5-1.5 MG/5ML PO SYRP: 5.0000 mL | ORAL_SOLUTION | Freq: Three times a day (TID) | ORAL | 0 refills | Status: DC | PRN

## 2017-07-10 NOTE — Telephone Encounter (Signed)
Copied from CRM [id number]. Topic: Quick Communication - Rx Refill/Question >> Jul 10, 2017 11:01 AM Maia Pettiesrtiz, Kristie S wrote: Medication: tessalon pearls - pt requesting a liquid as she keeps coughing up the pill please advise Preferred Pharmacy (with phone number or street name): [facility] [address], [place] - [id number] [phone] (Phone) [phone] (Fax)

## 2017-07-10 NOTE — Telephone Encounter (Signed)
Prescription of cough syrup sent to patient's pharmacy.

## 2017-09-13 ENCOUNTER — Encounter: Payer: Self-pay | Admitting: Medical

## 2017-09-13 ENCOUNTER — Other Ambulatory Visit (HOSPITAL_COMMUNITY)
Admission: RE | Admit: 2017-09-13 | Discharge: 2017-09-13 | Disposition: A | Discharge status: Home / Self Care | Payer: BLUE CROSS/BLUE SHIELD | Source: Ambulatory Visit | Attending: Medical | Admitting: Medical

## 2017-09-13 ENCOUNTER — Ambulatory Visit: Payer: BLUE CROSS/BLUE SHIELD | Admitting: Medical

## 2017-09-13 VITALS — BP 122/62 | HR 74 | Temp 98.4°F | Resp 16 | Ht 67.0 in | Wt 356.6 lb

## 2017-09-13 DIAGNOSIS — Z113 Encounter for screening for infections with a predominantly sexual mode of transmission: Secondary | ICD-10-CM

## 2017-09-13 DIAGNOSIS — R300 Dysuria: Secondary | ICD-10-CM | POA: Diagnosis not present

## 2017-09-13 LAB — POC URINALSYSI DIPSTICK (AUTOMATED)
BILIRUBIN UA: NEGATIVE
Blood, UA: NEGATIVE
GLUCOSE UA: NEGATIVE
Ketones, UA: NEGATIVE
NITRITE UA: NEGATIVE
Protein, UA: NEGATIVE
Spec Grav, UA: 1.025 (ref 1.010–1.025)
UROBILINOGEN UA: NEGATIVE U/dL — AB
pH, UA: 6 (ref 5.0–8.0)

## 2017-09-13 NOTE — Patient Instructions (Addendum)
You do have history of recent unprotected sex around the time of some transient urinary/vaginal symptoms.  However this is also coincided with the change of soap.  Symptoms are now resolved.  Decided to go ahead and get a urine culture as well as urine ancillary studies.  If your symptoms recur please let us know.  Otherwise we will follow your studies and let you know the results.  If studies come back positive we will treat for that condition.  Reminder to practices safe sex.    Further work-up past studies declined today.    Follow-up for annual sleep PE or as needed.    I will issue him that he is going to do a UA dip and and if there is going to culture to

## 2017-09-13 NOTE — Progress Notes (Signed)
Subjective:    Patient ID: Brooke Russell, female    DOB: 01-07-1991, 27 y.o.   MRN: [id number]  HPI  Pt in for for some recent burning and itching in vaginal area for about 3 days.  Pt states symptoms occurred around changing soap. But when she changed back to prior soap her symptoms resolved. Now symptoms subsided for 2 days.  No vaginal dc. No fever, no chills, no sweats of vomiting.   No pain on urination.  Pt states maybe white dc when had symptoms. But now none.  Pt did have  Unprotected sex with boyfriend about one week before symptoms started    Review of Systems  Constitutional: Negative for chills, fatigue and fever.  Respiratory: Negative for cough, chest tightness, shortness of breath and wheezing.   Cardiovascular: Negative for chest pain and palpitations.  Gastrointestinal: Negative for abdominal pain.  Genitourinary: Negative for dysuria, frequency, pelvic pain, urgency, vaginal bleeding, vaginal discharge and vaginal pain.       See hpi. Some vaginal itch and burning 3 days ago but none now.  Musculoskeletal: Negative for back pain.  Skin: Negative for rash.  Neurological: Negative for dizziness, seizures, syncope, weakness and headaches.  Hematological: Negative for adenopathy. Does not bruise/bleed easily.  Psychiatric/Behavioral: Negative for behavioral problems, confusion and sleep disturbance. The patient is not nervous/anxious.      Past Medical History:  Diagnosis Date  . Allergy    spring.  . Asthma   . History of chicken pox   . Irregular menses      Social History   Socioeconomic History  . Marital status: Single    Spouse name: Not on file  . Number of children: Not on file  . Years of education: Not on file  . Highest education level: Not on file  Occupational History  . Not on file  Social Needs  . Financial resource strain: Not on file  . Food insecurity:    Worry: Not on file    Inability: Not on file  . Transportation needs:      Medical: Not on file    Non-medical: Not on file  Tobacco Use  . Smoking status: Never Smoker  . Smokeless tobacco: Never Used  Substance and Sexual Activity  . Alcohol use: Yes    Comment: very rare  . Drug use: No  . Sexual activity: Yes    Birth control/protection: Abstinence    Comment: 1ST INTERCOURSE- 5923, PARTNERS- 1 . in past active but presently abstinence.  Lifestyle  . Physical activity:    Days per week: Not on file    Minutes per session: Not on file  . Stress: Not on file  Relationships  . Social connections:    Talks on phone: Not on file    Gets together: Not on file    Attends religious service: Not on file    Active member of club or organization: Not on file    Attends meetings of clubs or organizations: Not on file    Relationship status: Not on file  . Intimate partner violence:    Fear of current or ex partner: Not on file    Emotionally abused: Not on file    Physically abused: Not on file    Forced sexual activity: Not on file  Other Topics Concern  . Not on file  Social History Narrative  . Not on file    Past Surgical History:  Procedure Laterality Date  . NO PAST  SURGERIES      Family History  Problem Relation Age of Onset  . Hypertension Mother   . Healthy Father   . Healthy Sister   . Healthy Brother   . Diabetes Maternal Grandmother        MGGM  . Ovarian cancer Maternal Grandmother        MGGM  . Alcohol abuse Neg Hx   . Arthritis Neg Hx   . Asthma Neg Hx   . Birth defects Neg Hx   . Cancer Neg Hx   . COPD Neg Hx   . Depression Neg Hx   . Drug abuse Neg Hx   . Early death Neg Hx   . Hearing loss Neg Hx   . Heart disease Neg Hx   . Hyperlipidemia Neg Hx   . Kidney disease Neg Hx   . Learning disabilities Neg Hx   . Mental illness Neg Hx   . Mental retardation Neg Hx   . Miscarriages / Stillbirths Neg Hx   . Stroke Neg Hx   . Vision loss Neg Hx   . Varicose Veins Neg Hx     Allergies  Allergen Reactions  .  Almond (Diagnostic) Nausea And Vomiting    Almond Milk    Current Outpatient Medications on File Prior to Visit  Medication Sig Dispense Refill  . albuterol (PROVENTIL HFA;VENTOLIN HFA) 108 (90 Base) MCG/ACT inhaler Inhale 2 puffs into the lungs every 6 (six) hours as needed for wheezing or shortness of breath. 1 Inhaler 0  . norgestrel-ethinyl estradiol (LO/OVRAL,CRYSELLE) 0.3-30 MG-MCG tablet Take 1 tablet by mouth daily. 3 Package 4   No current facility-administered medications on file prior to visit.     Pulse 74   Temp 98.4 F (36.9 C) (Oral)   Resp 16   Ht 5\' 7"  (1.702 m)   Wt (!) 356 lb 9.6 oz (161.8 kg)   SpO2 99%   BMI 55.85 kg/m       Objective:   Physical Exam  General- No acute distress. Pleasant patient. Neck- Full range of motion, no jvd Lungs- Clear, even and unlabored. Heart- regular rate and rhythm. Neurologic- CNII- XII grossly intact.  Abdomen- soft, non-tender abdomen, suprapubic and adnexal area.non-distended,+bs. No rebound or guarding.  Back- no cva tenderness      Assessment & Plan:  You do have history of recent unprotected sex around the time of some transient urinary/vaginal symptoms.  However this is also coincided with the change of soap.  Symptoms are now resolved.  Decided to go ahead and get a urine culture as well as urine ancillary studies.  If your symptoms recur please let us know.  Otherwise we will follow your studies and let you know the results.  If studies come back positive we will treat for that condition.  Reminder to practices safe sex.    Further work-up past studies declined today.    Follow-up for annual sleep PE or as needed.    I will issue him that he is going to do a UA dip and and if there is going to culture to  [employer] [employer]

## 2017-09-13 NOTE — Addendum Note (Signed)
Addended by: Orlene OchRENCE, Wendle Kina N on: 09/13/2017 02:43 PM   Modules accepted: Orders

## 2017-09-14 LAB — URINE CULTURE
MICRO NUMBER: 90506910
RESULT: NO GROWTH
SPECIMEN QUALITY:: ADEQUATE

## 2017-09-14 LAB — URINE CYTOLOGY ANCILLARY ONLY
Chlamydia: NEGATIVE
Neisseria Gonorrhea: NEGATIVE
Trichomonas: NEGATIVE

## 2017-09-28 ENCOUNTER — Telehealth: Payer: Self-pay | Admitting: Medical

## 2017-09-28 LAB — URINE CYTOLOGY ANCILLARY ONLY: CANDIDA VAGINITIS: NEGATIVE

## 2017-09-28 MED ORDER — METRONIDAZOLE 500 MG PO TABS: 500.0000 mg | ORAL_TABLET | Freq: Three times a day (TID) | ORAL | 0 refills | Status: DC

## 2017-09-28 NOTE — Telephone Encounter (Signed)
Rx flagyl sent to patient pharmacy.

## 2017-10-08 ENCOUNTER — Encounter (HOSPITAL_COMMUNITY): Payer: Self-pay | Admitting: Emergency Medicine

## 2017-10-08 ENCOUNTER — Ambulatory Visit (HOSPITAL_COMMUNITY)
Admission: EM | Admit: 2017-10-08 | Discharge: 2017-10-08 | Disposition: A | Discharge status: Home / Self Care | Payer: BLUE CROSS/BLUE SHIELD | Attending: Internal Medicine | Admitting: Internal Medicine

## 2017-10-08 DIAGNOSIS — Z91018 Allergy to other foods: Secondary | ICD-10-CM | POA: Diagnosis not present

## 2017-10-08 DIAGNOSIS — Z833 Family history of diabetes mellitus: Secondary | ICD-10-CM | POA: Insufficient documentation

## 2017-10-08 DIAGNOSIS — Z8249 Family history of ischemic heart disease and other diseases of the circulatory system: Secondary | ICD-10-CM | POA: Insufficient documentation

## 2017-10-08 DIAGNOSIS — Z8041 Family history of malignant neoplasm of ovary: Secondary | ICD-10-CM | POA: Diagnosis not present

## 2017-10-08 DIAGNOSIS — Z791 Long term (current) use of non-steroidal anti-inflammatories (NSAID): Secondary | ICD-10-CM | POA: Insufficient documentation

## 2017-10-08 DIAGNOSIS — R51 Headache: Secondary | ICD-10-CM | POA: Insufficient documentation

## 2017-10-08 DIAGNOSIS — N938 Other specified abnormal uterine and vaginal bleeding: Secondary | ICD-10-CM | POA: Diagnosis not present

## 2017-10-08 DIAGNOSIS — J45909 Unspecified asthma, uncomplicated: Secondary | ICD-10-CM | POA: Insufficient documentation

## 2017-10-08 DIAGNOSIS — J029 Acute pharyngitis, unspecified: Secondary | ICD-10-CM | POA: Diagnosis present

## 2017-10-08 DIAGNOSIS — R110 Nausea: Secondary | ICD-10-CM | POA: Diagnosis not present

## 2017-10-08 DIAGNOSIS — Z79899 Other long term (current) drug therapy: Secondary | ICD-10-CM | POA: Insufficient documentation

## 2017-10-08 DIAGNOSIS — J00 Acute nasopharyngitis [common cold]: Secondary | ICD-10-CM | POA: Diagnosis not present

## 2017-10-08 LAB — POCT RAPID STREP A: STREPTOCOCCUS, GROUP A SCREEN (DIRECT): NEGATIVE

## 2017-10-08 MED ORDER — IBUPROFEN 800 MG PO TABS: 800.0000 mg | ORAL_TABLET | Freq: Three times a day (TID) | ORAL | 0 refills | Status: DC

## 2017-10-08 NOTE — ED Provider Notes (Signed)
[facility]    CSN: [id number] Arrival date & time: 10/08/17  1536     History   Chief Complaint Chief Complaint  Patient presents with  . Sore Throat    HPI Brooke Russell is a 27 y.o. female.   She presents today with 2-day history of headache, bad sore throat, little bit of postnasal drainage, achy all over.  Little bit of nausea.  Not vomiting, no diarrhea.  Little bit of discharge and says she is currently taking Flagyl for bacterial vaginosis.  Would like STD testing.  No pelvic or abdominal pain.    HPI  Past Medical History:  Diagnosis Date  . Allergy    spring.  . Asthma   . History of chicken pox   . Irregular menses     Patient Active Problem List   Diagnosis Date Noted  . DUB (dysfunctional uterine bleeding) 04/11/2017  . Morbid obesity (HCC) 06/23/2016    Past Surgical History:  Procedure Laterality Date  . NO PAST SURGERIES      OB History    Gravida  0   Para      Term      Preterm      AB      Living        SAB      TAB      Ectopic      Multiple      Live Births               Home Medications    Prior to Admission medications   Medication Sig Start Date End Date Taking? Authorizing Provider  albuterol (PROVENTIL HFA;VENTOLIN HFA) 108 (90 Base) MCG/ACT inhaler Inhale 2 puffs into the lungs every 6 (six) hours as needed for wheezing or shortness of breath. 07/09/17  Yes Saguier, Ramon Dredge, PA-C  metroNIDAZOLE (FLAGYL) 500 MG tablet Take 1 tablet (500 mg total) by mouth 3 (three) times daily. 09/28/17  Yes Saguier, Ramon Dredge, PA-C  norgestrel-ethinyl estradiol (LO/OVRAL,CRYSELLE) 0.3-30 MG-MCG tablet Take 1 tablet by mouth daily. 06/26/17  Yes Harrington Challenger, NP  ibuprofen (ADVIL,MOTRIN) 800 MG tablet Take 1 tablet (800 mg total) by mouth 3 (three) times daily. 10/08/17   Isa Rankin, MD    Family History Family History  Problem Relation Age of Onset  . Hypertension Mother   . Healthy Father   . Healthy  Sister   . Healthy Brother   . Diabetes Maternal Grandmother        MGGM  . Ovarian cancer Maternal Grandmother        MGGM  . Alcohol abuse Neg Hx   . Arthritis Neg Hx   . Asthma Neg Hx   . Birth defects Neg Hx   . Cancer Neg Hx   . COPD Neg Hx   . Depression Neg Hx   . Drug abuse Neg Hx   . Early death Neg Hx   . Hearing loss Neg Hx   . Heart disease Neg Hx   . Hyperlipidemia Neg Hx   . Kidney disease Neg Hx   . Learning disabilities Neg Hx   . Mental illness Neg Hx   . Mental retardation Neg Hx   . Miscarriages / Stillbirths Neg Hx   . Stroke Neg Hx   . Vision loss Neg Hx   . Varicose Veins Neg Hx     Social History Social History   Tobacco Use  . Smoking status: Never Smoker  .  Smokeless tobacco: Never Used  Substance Use Topics  . Alcohol use: Yes    Comment: very rare  . Drug use: No     Allergies   Almond (diagnostic)   Review of Systems Review of Systems  All other systems reviewed and are negative.    Physical Exam Triage Vital Signs ED Triage Vitals [10/08/17 1727]  Enc Vitals Group     BP (!) 152/86     Pulse Rate 82     Resp 16     Temp 98.7 F (37.1 C)     Temp src      SpO2 100 %     Weight      Height      Pain Score 5     Pain Loc    Updated Vital Signs BP (!) 152/86   Pulse 82   Temp 98.7 F (37.1 C)   Resp 16   SpO2 100%  Physical Exam  Constitutional: She is oriented to person, place, and time.  Laying down on exam table dozing but able to sit up for exam; looks ill but not toxic  HENT:  Head: Atraumatic.  Bilateral TMs are translucent, no erythema Moderate nasal congestion bilaterally Tonsils are prominent, deep red, with exudates bilaterally  Eyes:  Conjugate gaze observed, no eye redness/discharge  Neck: Neck supple.  Cardiovascular: Normal rate and regular rhythm.  Pulmonary/Chest: No respiratory distress. She has no wheezes. She has no rales.  Lungs clear, symmetric breath sounds   Abdominal: She exhibits  no distension.  Musculoskeletal: Normal range of motion.  Neurological: She is alert and oriented to person, place, and time.  Skin: Skin is warm and dry.  Nursing note and vitals reviewed.    UC Treatments / Results  Labs Results for orders placed or performed during the hospital encounter of 10/08/17  Culture, group A strep  Result Value Ref Range   Specimen Description THROAT    Special Requests NONE    Culture      CULTURE REINCUBATED FOR BETTER GROWTH Performed at [facility], [address], [place], [place] [address]    Report Status PENDING   HIV antibody  Result Value Ref Range   HIV Screen 4th Generation wRfx Non Reactive Non Reactive  RPR  Result Value Ref Range   RPR Ser Ql Non Reactive Non Reactive  POCT rapid strep A [facility])  Result Value Ref Range   Streptococcus, Group A Screen (Direct) NEGATIVE NEGATIVE  Urine cytology ancillary only  Result Value Ref Range   Chlamydia Negative    Neisseria gonorrhea Negative    Trichomonas Negative     EKG None  Radiology No results found.  Procedures Procedures (including critical care time) None today  Medications Ordered in UC Medications - No data to display  Final Clinical Impressions(s) / UC Diagnoses   Final diagnoses:  Acute nasopharyngitis     Discharge Instructions     Strep test at the urgent care today was negative, throat culture is pending.  The urgent care will contact you if further treatment is needed.  Test for common pelvic infections were also done today, and the urgent care will notify you if further action is needed.  Anticipate gradual improvement in sore throat, headache, body aches, over the next several days.  Push fluids and rest.  Tylenol or Motrin as needed for discomfort.  Recheck for new fever >100.5, increasing phlegm production/nasal discharge, or if not starting to improve  in a few days.  Chloraseptic spray or lozenges can also help with bad sore  throat.     ED Prescriptions    Medication Sig Dispense Auth. Provider   ibuprofen (ADVIL,MOTRIN) 800 MG tablet Take 1 tablet (800 mg total) by mouth 3 (three) times daily. 21 tablet Isa Rankin, MD       Isa Rankin, MD 10/09/17 [phone]

## 2017-10-08 NOTE — ED Triage Notes (Signed)
PT reports sore throat for 2 days. PT reports fatigue and body aches. PT reports some night sweats as well. Ears popping and congestion as well.   Wants HIV testing.

## 2017-10-08 NOTE — Discharge Instructions (Addendum)
Strep test at the urgent care today was negative, throat culture is pending.  The urgent care will contact you if further treatment is needed.  Test for common pelvic infections were also done today, and the urgent care will notify you if further action is needed.  Anticipate gradual improvement in sore throat, headache, body aches, over the next several days.  Push fluids and rest.  Tylenol or Motrin as needed for discomfort.  Recheck for new fever >100.5, increasing phlegm production/nasal discharge, or if not starting to improve in a few days.  Chloraseptic spray or lozenges can also help with bad sore throat.

## 2017-10-09 LAB — URINE CYTOLOGY ANCILLARY ONLY
Chlamydia: NEGATIVE
Neisseria Gonorrhea: NEGATIVE
Trichomonas: NEGATIVE

## 2017-10-09 LAB — HIV ANTIBODY (ROUTINE TESTING W REFLEX): HIV SCREEN 4TH GENERATION: NONREACTIVE

## 2017-10-09 LAB — RPR: RPR: NONREACTIVE

## 2017-10-10 LAB — URINE CYTOLOGY ANCILLARY ONLY
Bacterial vaginitis: POSITIVE — AB
CANDIDA VAGINITIS: NEGATIVE

## 2017-10-11 LAB — CULTURE, GROUP A STREP (THRC)

## 2017-10-12 ENCOUNTER — Telehealth (HOSPITAL_COMMUNITY): Payer: Self-pay

## 2017-10-12 NOTE — Telephone Encounter (Signed)
Negative strep.  rpr and hiv are negative.  STD screening negative.  Bacterial vaginosis is positive, patient reports having treatment from primary care doctor.

## 2017-11-28 ENCOUNTER — Inpatient Hospital Stay (HOSPITAL_COMMUNITY)
Admission: AD | Admit: 2017-11-28 | Discharge: 2017-11-28 | Disposition: A | Discharge status: Home / Self Care | Payer: BLUE CROSS/BLUE SHIELD | Source: Ambulatory Visit | Attending: Obstetrics and Gynecology | Admitting: Obstetrics and Gynecology

## 2017-11-28 ENCOUNTER — Other Ambulatory Visit: Payer: Self-pay

## 2017-11-28 ENCOUNTER — Encounter (HOSPITAL_COMMUNITY): Payer: Self-pay | Admitting: *Deleted

## 2017-11-28 DIAGNOSIS — J45909 Unspecified asthma, uncomplicated: Secondary | ICD-10-CM | POA: Diagnosis not present

## 2017-11-28 DIAGNOSIS — Z3202 Encounter for pregnancy test, result negative: Secondary | ICD-10-CM | POA: Insufficient documentation

## 2017-11-28 DIAGNOSIS — N926 Irregular menstruation, unspecified: Secondary | ICD-10-CM | POA: Insufficient documentation

## 2017-11-28 DIAGNOSIS — D649 Anemia, unspecified: Secondary | ICD-10-CM | POA: Diagnosis not present

## 2017-11-28 DIAGNOSIS — Z79899 Other long term (current) drug therapy: Secondary | ICD-10-CM | POA: Diagnosis not present

## 2017-11-28 DIAGNOSIS — E860 Dehydration: Secondary | ICD-10-CM | POA: Diagnosis not present

## 2017-11-28 DIAGNOSIS — R55 Syncope and collapse: Secondary | ICD-10-CM

## 2017-11-28 LAB — COMPREHENSIVE METABOLIC PANEL
ALBUMIN: 3.4 g/dL — AB (ref 3.5–5.0)
ALK PHOS: 69 U/L (ref 38–126)
ALT: 22 U/L (ref 0–44)
ANION GAP: 8 (ref 5–15)
AST: 23 U/L (ref 15–41)
BUN: 9 mg/dL (ref 6–20)
CALCIUM: 8.8 mg/dL — AB (ref 8.9–10.3)
CO2: 22 mmol/L (ref 22–32)
CREATININE: 0.71 mg/dL (ref 0.44–1.00)
Chloride: 104 mmol/L (ref 98–111)
GFR calc non Af Amer: >60 mL/min (ref >60)
GLUCOSE: 86 mg/dL (ref 70–99)
Potassium: 3.7 mmol/L (ref 3.5–5.1)
SODIUM: 134 mmol/L — AB (ref 135–145)
Total Bilirubin: 0.4 mg/dL (ref 0.3–1.2)
Total Protein: 7.3 g/dL (ref 6.5–8.1)

## 2017-11-28 LAB — POCT PREGNANCY, URINE: Preg Test, Ur: NEGATIVE

## 2017-11-28 LAB — AMYLASE: AMYLASE: 76 U/L (ref 28–100)

## 2017-11-28 LAB — CBC
HCT: 33.6 % — ABNORMAL LOW (ref 36.0–46.0)
Hemoglobin: 10.6 g/dL — ABNORMAL LOW (ref 12.0–15.0)
MCH: 26.5 pg (ref 26.0–34.0)
MCHC: 31.5 g/dL (ref 30.0–36.0)
MCV: 84 fL (ref 78.0–100.0)
PLATELETS: 256 10*3/uL (ref 150–400)
RBC: 4 MIL/uL (ref 3.87–5.11)
RDW: 15.5 % (ref 11.5–15.5)
WBC: 4.7 10*3/uL (ref 4.0–10.5)

## 2017-11-28 LAB — URINALYSIS, ROUTINE W REFLEX MICROSCOPIC
BILIRUBIN URINE: NEGATIVE
GLUCOSE, UA: NEGATIVE mg/dL
KETONES UR: NEGATIVE mg/dL
LEUKOCYTES UA: NEGATIVE
NITRITE: NEGATIVE
PH: 6 (ref 5.0–8.0)
Protein, ur: NEGATIVE mg/dL
Specific Gravity, Urine: 1.02 (ref 1.005–1.030)

## 2017-11-28 LAB — LIPASE, BLOOD: LIPASE: 25 U/L (ref 11–51)

## 2017-11-28 NOTE — MAU Note (Signed)
Pt. States she passed out 6 am. Pt. States bleeding is minimal, less than periods. Pt. States stomach pain on left lower side rated 2/10 states it began Sunday and is intermittent. Reports pain was not bad enough to come in but she needs a doctors note to return to work. LMP is current but much lighter than normal.

## 2017-11-28 NOTE — MAU Provider Note (Signed)
History     CSN: [id number]  Arrival date and time: 11/28/17 [id number]   First Provider Initiated Contact with Patient 11/28/17 0920      Chief Complaint  Patient presents with  . Abdominal Pain  . Possible Pregnancy  . Loss of Consciousness   27 y.o. Female presenting after syncope episode earlier this am at work. There was LOC and she felt confused when she came to. Denies head trauma or injury. She thought she may be pregnant because she had a lighter menses this month. She is not using contraception. She is having some mild LUQ pain and nausea. She admit to not eating or drinking anything since 6pm yesterday. She has recently started a diet and added exercise in hopes of weight loss.   Past Medical History:  Diagnosis Date  . Allergy    spring.  . Asthma   . History of chicken pox   . Irregular menses     Past Surgical History:  Procedure Laterality Date  . NO PAST SURGERIES      Family History  Problem Relation Age of Onset  . Hypertension Mother   . Healthy Father   . Healthy Sister   . Healthy Brother   . Diabetes Maternal Grandmother        MGGM  . Ovarian cancer Maternal Grandmother        MGGM  . Alcohol abuse Neg Hx   . Arthritis Neg Hx   . Asthma Neg Hx   . Birth defects Neg Hx   . Cancer Neg Hx   . COPD Neg Hx   . Depression Neg Hx   . Drug abuse Neg Hx   . Early death Neg Hx   . Hearing loss Neg Hx   . Heart disease Neg Hx   . Hyperlipidemia Neg Hx   . Kidney disease Neg Hx   . Learning disabilities Neg Hx   . Mental illness Neg Hx   . Mental retardation Neg Hx   . Miscarriages / Stillbirths Neg Hx   . Stroke Neg Hx   . Vision loss Neg Hx   . Varicose Veins Neg Hx     Social History   Tobacco Use  . Smoking status: Never Smoker  . Smokeless tobacco: Never Used  Substance Use Topics  . Alcohol use: Yes    Comment: very rare  . Drug use: No    Allergies:  Allergies  Allergen Reactions  . Almond (Diagnostic) Nausea And Vomiting     Almond Milk    Medications Prior to Admission  Medication Sig Dispense Refill Last Dose  . ibuprofen (ADVIL,MOTRIN) 800 MG tablet Take 1 tablet (800 mg total) by mouth 3 (three) times daily. 21 tablet 0 Past Month at Unknown time  . albuterol (PROVENTIL HFA;VENTOLIN HFA) 108 (90 Base) MCG/ACT inhaler Inhale 2 puffs into the lungs every 6 (six) hours as needed for wheezing or shortness of breath. 1 Inhaler 0 More than a month at Unknown time  . metroNIDAZOLE (FLAGYL) 500 MG tablet Take 1 tablet (500 mg total) by mouth 3 (three) times daily. 21 tablet 0 More than a month at Unknown time  . norgestrel-ethinyl estradiol (LO/OVRAL,CRYSELLE) 0.3-30 MG-MCG tablet Take 1 tablet by mouth daily. 3 Package 4 More than a month at Unknown time    Review of Systems  Constitutional: Positive for appetite change. Negative for fever.  Gastrointestinal: Positive for abdominal pain, constipation and nausea. Negative for diarrhea and vomiting.  Genitourinary: Positive for  menstrual problem and vaginal bleeding.  Neurological: Positive for syncope and light-headedness.   Physical Exam   Blood pressure (!) 114/59, pulse 68, temperature 98 F (36.7 C), temperature source Oral, resp. rate 18, height 5' 7.5" (1.715 m), weight (!) 361 lb (163.7 kg), last menstrual period 11/19/2017, SpO2 98 %.  Physical Exam  Constitutional: She is oriented to person, place, and time. She appears well-developed and well-nourished. No distress.  HENT:  Head: Normocephalic and atraumatic.  Neck: Normal range of motion.  Cardiovascular: Normal rate, regular rhythm and normal heart sounds.  Respiratory: Effort normal and breath sounds normal. No respiratory distress. She has no wheezes. She has no rales.  GI: Soft. She exhibits no distension and no mass. There is tenderness (LUQ). There is no rebound and no guarding.  Musculoskeletal: Normal range of motion.  Neurological: She is alert and oriented to person, place, and time.   Skin: Skin is warm and dry.  Psychiatric: She has a normal mood and affect.   Results for orders placed or performed during the hospital encounter of 11/28/17 (from the past 24 hour(s))  Urinalysis, Routine w reflex microscopic     Status: Abnormal   Collection Time: 11/28/17  9:15 AM  Result Value Ref Range   Color, Urine YELLOW YELLOW   APPearance CLEAR CLEAR   Specific Gravity, Urine 1.020 1.005 - 1.030   pH 6.0 5.0 - 8.0   Glucose, UA NEGATIVE NEGATIVE mg/dL   Hgb urine dipstick LARGE (A) NEGATIVE   Bilirubin Urine NEGATIVE NEGATIVE   Ketones, ur NEGATIVE NEGATIVE mg/dL   Protein, ur NEGATIVE NEGATIVE mg/dL   Nitrite NEGATIVE NEGATIVE   Leukocytes, UA NEGATIVE NEGATIVE   RBC / HPF 21-50 0 - 5 RBC/hpf   WBC, UA 0-5 0 - 5 WBC/hpf   Bacteria, UA RARE (A) NONE SEEN   Squamous Epithelial / LPF 0-5 0 - 5   Mucus PRESENT   Pregnancy, urine POC     Status: None   Collection Time: 11/28/17  9:17 AM  Result Value Ref Range   Preg Test, Ur NEGATIVE NEGATIVE  Comprehensive metabolic panel     Status: Abnormal   Collection Time: 11/28/17 10:42 AM  Result Value Ref Range   Sodium 134 (L) 135 - 145 mmol/L   Potassium 3.7 3.5 - 5.1 mmol/L   Chloride 104 98 - 111 mmol/L   CO2 22 22 - 32 mmol/L   Glucose, Bld 86 70 - 99 mg/dL   BUN 9 6 - 20 mg/dL   Creatinine, Ser 1.61 0.44 - 1.00 mg/dL   Calcium 8.8 (L) 8.9 - 10.3 mg/dL   Total Protein 7.3 6.5 - 8.1 g/dL   Albumin 3.4 (L) 3.5 - 5.0 g/dL   AST 23 15 - 41 U/L   ALT 22 0 - 44 U/L   Alkaline Phosphatase 69 38 - 126 U/L   Total Bilirubin 0.4 0.3 - 1.2 mg/dL   GFR calc non Af Amer >60 >60 mL/min   GFR calc Af Amer >60 >60 mL/min   Anion gap 8 5 - 15  CBC     Status: Abnormal   Collection Time: 11/28/17 10:42 AM  Result Value Ref Range   WBC 4.7 4.0 - 10.5 K/uL   RBC 4.00 3.87 - 5.11 MIL/uL   Hemoglobin 10.6 (L) 12.0 - 15.0 g/dL   HCT 09.6 (L) 04.5 - 40.9 %   MCV 84.0 78.0 - 100.0 fL   MCH 26.5 26.0 - 34.0 pg  MCHC 31.5 30.0 -  36.0 g/dL   RDW 16.1 09.6 - 04.5 %   Platelets 256 150 - 400 K/uL  Lipase, blood     Status: None   Collection Time: 11/28/17 10:42 AM  Result Value Ref Range   Lipase 25 11 - 51 U/L  Amylase     Status: None   Collection Time: 11/28/17 10:42 AM  Result Value Ref Range   Amylase 76 28 - 100 U/L   MAU Course  Procedures  MDM Labs and EKG ordered. EKG interpreted by Dr. Irma Newness, no acute pathology. No evidence of pregnancy or acute abdominal process. Dehydration and mild anemia noted, could be cause of sx along with inadequate nutrition. Discussed findings with pt. Recommend f/u with PCP. Stable for discharge home.   Assessment and Plan   1. Syncope, unspecified syncope type   2. Dehydration   3. Anemia, unspecified type   4. Pregnancy examination or test, negative result    Discharge home Follow up with PCP in 1-2 weeks Hydrate and eat often Increase Fe rick foods Start MTV  Allergies as of 11/28/2017      Reactions   Almond (diagnostic) Nausea And Vomiting   Almond Milk      Medication List    TAKE these medications   albuterol 108 (90 Base) MCG/ACT inhaler Commonly known as:  PROVENTIL HFA;VENTOLIN HFA Inhale 2 puffs into the lungs every 6 (six) hours as needed for wheezing or shortness of breath.   ibuprofen 800 MG tablet Commonly known as:  ADVIL,MOTRIN Take 1 tablet (800 mg total) by mouth 3 (three) times daily.   metroNIDAZOLE 500 MG tablet Commonly known as:  FLAGYL Take 1 tablet (500 mg total) by mouth 3 (three) times daily.   norgestrel-ethinyl estradiol 0.3-30 MG-MCG tablet Commonly known as:  LO/OVRAL,CRYSELLE Take 1 tablet by mouth daily.      Donette Larry, CNM 11/28/2017, 11:45 AM

## 2017-11-28 NOTE — Discharge Instructions (Signed)
Anemia Anemia is a condition in which you do not have enough red blood cells or hemoglobin. Hemoglobin is a substance in red blood cells that carries oxygen. When you do not have enough red blood cells or hemoglobin (are anemic), your body cannot get enough oxygen and your organs may not work properly. As a result, you may feel very tired or have other problems. What are the causes? Common causes of anemia include:  Excessive bleeding. Anemia can be caused by excessive bleeding inside or outside the body, including bleeding from the intestine or from periods in women.  Poor nutrition.  Long-lasting (chronic) kidney, thyroid, and liver disease.  Bone marrow disorders.  Cancer and treatments for cancer.  HIV (human immunodeficiency virus) and AIDS (acquired immunodeficiency syndrome).  Treatments for HIV and AIDS.  Spleen problems.  Blood disorders.  Infections, medicines, and autoimmune disorders that destroy red blood cells.  What are the signs or symptoms? Symptoms of this condition include:  Minor weakness.  Dizziness.  Headache.  Feeling heartbeats that are irregular or faster than normal (palpitations).  Shortness of breath, especially with exercise.  Paleness.  Cold sensitivity.  Indigestion.  Nausea.  Difficulty sleeping.  Difficulty concentrating.  Symptoms may occur suddenly or develop slowly. If your anemia is mild, you may not have symptoms. How is this diagnosed? This condition is diagnosed based on:  Blood tests.  Your medical history.  A physical exam.  Bone marrow biopsy.  Your health care provider may also check your stool (feces) for blood and may do additional testing to look for the cause of your bleeding. You may also have other tests, including:  Imaging tests, such as a CT scan or MRI.  Endoscopy.  Colonoscopy.  How is this treated? Treatment for this condition depends on the cause. If you continue to lose a lot of blood,  you may need to be treated at a hospital. Treatment may include:  Taking supplements of iron, vitamin T02, or folic acid.  Taking a hormone medicine (erythropoietin) that can help to stimulate red blood cell growth.  Having a blood transfusion. This may be needed if you lose a lot of blood.  Making changes to your diet.  Having surgery to remove your spleen.  Follow these instructions at home:  Take over-the-counter and prescription medicines only as told by your health care provider.  Take supplements only as told by your health care provider.  Follow any diet instructions that you were given.  Keep all follow-up visits as told by your health care provider. This is important. Contact a health care provider if:  You develop new bleeding anywhere in the body. Get help right away if:  You are very weak.  You are short of breath.  You have pain in your abdomen or chest.  You are dizzy or feel faint.  You have trouble concentrating.  You have bloody or black, tarry stools.  You vomit repeatedly or you vomit up blood. Summary  Anemia is a condition in which you do not have enough red blood cells or enough of a substance in your red blood cells that carries oxygen (hemoglobin).  Symptoms may occur suddenly or develop slowly.  If your anemia is mild, you may not have symptoms.  This condition is diagnosed with blood tests as well as a medical history and physical exam. Other tests may be needed.  Treatment for this condition depends on the cause of the anemia. This information is not intended to replace advice  given to you by your health care provider. Make sure you discuss any questions you have with your health care provider. Document Released: 06/15/2004 Document Revised: 06/09/2016 Document Reviewed: 06/09/2016 Elsevier Interactive Patient Education  2018 [employer].  Dehydration, Adult Dehydration is when there is not enough fluid or water in your body. This  happens when you lose more fluids than you take in. Dehydration can range from mild to very bad. It should be treated right away to keep it from getting very bad. Symptoms of mild dehydration may include:  Thirst.  Dry lips.  Slightly dry mouth.  Dry, warm skin.  Dizziness. Symptoms of moderate dehydration may include:  Very dry mouth.  Muscle cramps.  Dark pee (urine). Pee may be the color of tea.  Your body making less pee.  Your eyes making fewer tears.  Heartbeat that is uneven or faster than normal (palpitations).  Headache.  Light-headedness, especially when you stand up from sitting.  Fainting (syncope). Symptoms of very bad dehydration may include:  Changes in skin, such as: ? Cold and clammy skin. ? Blotchy (mottled) or pale skin. ? Skin that does not quickly return to normal after being lightly pinched and let go (poor skin turgor).  Changes in body fluids, such as: ? Feeling very thirsty. ? Your eyes making fewer tears. ? Not sweating when body temperature is high, such as in hot weather. ? Your body making very little pee.  Changes in vital signs, such as: ? Weak pulse. ? Pulse that is more than 100 beats a minute when you are sitting still. ? Fast breathing. ? Low blood pressure.  Other changes, such as: ? Sunken eyes. ? Cold hands and feet. ? Confusion. ? Lack of energy (lethargy). ? Trouble waking up from sleep. ? Short-term weight loss. ? Unconsciousness. Follow these instructions at home:  If told by your doctor, drink an ORS: ? Make an ORS by using instructions on the package. ? Start by drinking small amounts, about  cup (120 mL) every 5-10 minutes. ? Slowly drink more until you have had the amount that your doctor said to have.  Drink enough clear fluid to keep your pee clear or pale yellow. If you were told to drink an ORS, finish the ORS first, then start slowly drinking clear fluids. Drink fluids such as: ? Water. Do not drink  only water by itself. Doing that can make the salt (sodium) level in your body get too low (hyponatremia). ? Ice chips. ? Fruit juice that you have added water to (diluted). ? Low-calorie sports drinks.  Avoid: ? Alcohol. ? Drinks that have a lot of sugar. These include high-calorie sports drinks, fruit juice that does not have water added, and soda. ? Caffeine. ? Foods that are greasy or have a lot of fat or sugar.  Take over-the-counter and prescription medicines only as told by your doctor.  Do not take salt tablets. Doing that can make the salt level in your body get too high (hypernatremia).  Eat foods that have minerals (electrolytes). Examples include bananas, oranges, potatoes, tomatoes, and spinach.  Keep all follow-up visits as told by your doctor. This is important. Contact a doctor if:  You have belly (abdominal) pain that: ? Gets worse. ? Stays in one area (localizes).  You have a rash.  You have a stiff neck.  You get angry or annoyed more easily than normal (irritability).  You are more sleepy than normal.  You have a harder time  waking up than normal.  You feel: ? Weak. ? Dizzy. ? Very thirsty.  You have peed (urinated) only a small amount of very dark pee during 6-8 hours. Get help right away if:  You have symptoms of very bad dehydration.  You cannot drink fluids without throwing up (vomiting).  Your symptoms get worse with treatment.  You have a fever.  You have a very bad headache.  You are throwing up or having watery poop (diarrhea) and it: ? Gets worse. ? Does not go away.  You have blood or something green (bile) in your throw-up.  You have blood in your poop (stool). This may cause poop to look black and tarry.  You have not peed in 6-8 hours.  You pass out (faint).  Your heart rate when you are sitting still is more than 100 beats a minute.  You have trouble breathing. This information is not intended to replace advice given  to you by your health care provider. Make sure you discuss any questions you have with your health care provider. Document Released: 03/04/2009 Document Revised: 11/26/2015 Document Reviewed: 07/02/2015 Elsevier Interactive Patient Education  2018 [employer].

## 2017-11-28 NOTE — MAU Note (Signed)
Pt reports she passed out at work this am, denies injuries. States she has been having lower abd pain off/on since Sunday.

## 2017-11-28 NOTE — MAU Provider Note (Addendum)
History     CSN: [id number]  Arrival date and time: 11/28/17 4098   First Provider Initiated Contact with Patient 11/28/17 0920      Chief Complaint  Patient presents with  . Abdominal Pain  . Possible Pregnancy  . Loss of Consciousness   Patient is a 27 year old G0P0 female who presents today after a syncopal episode that occurred earlier this morning while she was at work. The patient states that she began feeling lightheaded before passing out. She states that she lost consciousness and that she felt confused when she regained consciousness. She denies hitting her head or inuring herself during her fall. She has never passed out before. She has no known cardiac or neurologic issues. She has a history of asthma and irregular menses. She reports that she is currently menstruating, stating that she has had a lighter than normal period for a little over a week now. She reports that her last period before her current one was around Father's Day and that she only had bleeding for 1 day. She is currently complaining of generalized left sided abdominal pain and mild nausea. She is concerned that she might be pregnant.     Abdominal Pain  The primary symptoms of the illness include abdominal pain, nausea and vaginal bleeding. The primary symptoms of the illness do not include fever, shortness of breath, vomiting, diarrhea, dysuria or vaginal discharge.  Additional symptoms associated with the illness include constipation.  Possible Pregnancy  Associated symptoms include abdominal pain and nausea. Pertinent negatives include no chest pain, fever, headaches, numbness, vomiting or weakness.  Loss of Consciousness  Associated symptoms include abdominal pain and nausea. Pertinent negatives include no chest pain, fever, headaches, numbness, vomiting or weakness.   Past Medical History:  Diagnosis Date  . Allergy    spring.  . Asthma   . History of chicken pox   . Irregular menses     Past  Surgical History:  Procedure Laterality Date  . NO PAST SURGERIES      Family History  Problem Relation Age of Onset  . Hypertension Mother   . Healthy Father   . Healthy Sister   . Healthy Brother   . Diabetes Maternal Grandmother        MGGM  . Ovarian cancer Maternal Grandmother        MGGM  . Alcohol abuse Neg Hx   . Arthritis Neg Hx   . Asthma Neg Hx   . Birth defects Neg Hx   . Cancer Neg Hx   . COPD Neg Hx   . Depression Neg Hx   . Drug abuse Neg Hx   . Early death Neg Hx   . Hearing loss Neg Hx   . Heart disease Neg Hx   . Hyperlipidemia Neg Hx   . Kidney disease Neg Hx   . Learning disabilities Neg Hx   . Mental illness Neg Hx   . Mental retardation Neg Hx   . Miscarriages / Stillbirths Neg Hx   . Stroke Neg Hx   . Vision loss Neg Hx   . Varicose Veins Neg Hx     Social History   Tobacco Use  . Smoking status: Never Smoker  . Smokeless tobacco: Never Used  Substance Use Topics  . Alcohol use: Yes    Comment: very rare  . Drug use: No    Allergies:  Allergies  Allergen Reactions  . Almond (Diagnostic) Nausea And Vomiting    Almond Milk  Medications Prior to Admission  Medication Sig Dispense Refill Last Dose  . ibuprofen (ADVIL,MOTRIN) 800 MG tablet Take 1 tablet (800 mg total) by mouth 3 (three) times daily. 21 tablet 0 Past Month at Unknown time  . albuterol (PROVENTIL HFA;VENTOLIN HFA) 108 (90 Base) MCG/ACT inhaler Inhale 2 puffs into the lungs every 6 (six) hours as needed for wheezing or shortness of breath. 1 Inhaler 0 More than a month at Unknown time  . metroNIDAZOLE (FLAGYL) 500 MG tablet Take 1 tablet (500 mg total) by mouth 3 (three) times daily. 21 tablet 0 More than a month at Unknown time  . norgestrel-ethinyl estradiol (LO/OVRAL,CRYSELLE) 0.3-30 MG-MCG tablet Take 1 tablet by mouth daily. 3 Package 4 More than a month at Unknown time    Review of Systems  Constitutional: Positive for appetite change. Negative for fever.        Decreased appetite since changing her diet and exercising more.   Respiratory: Negative for shortness of breath.   Cardiovascular: Positive for syncope. Negative for chest pain and palpitations.  Gastrointestinal: Positive for abdominal pain, constipation and nausea. Negative for diarrhea and vomiting.       Generalized left sided abdominal pain. Constipation for about 2 days with some relief from eating Fiber One bars. Some nausea since her syncopal episode this morning.   Genitourinary: Positive for menstrual problem and vaginal bleeding. Negative for dysuria, pelvic pain, vaginal discharge and vaginal pain.       History of irregular menses. Currently menstruating, x 1 week.   Neurological: Positive for syncope and light-headedness. Negative for dizziness, facial asymmetry, speech difficulty, weakness, numbness and headaches.  Psychiatric/Behavioral: Negative for confusion.   Physical Exam   Blood pressure 130/69, pulse 78, temperature 98 F (36.7 C), temperature source Oral, resp. rate 18, height 5' 7.5" (1.715 m), weight (!) 361 lb (163.7 kg), last menstrual period 11/19/2017, SpO2 98 %.  Physical Exam  Constitutional: She is oriented to person, place, and time. She appears well-developed and well-nourished.  HENT:  Head: Normocephalic and atraumatic.  Cardiovascular: Normal rate, regular rhythm and normal heart sounds.  Respiratory: Effort normal and breath sounds normal.  GI: Soft. There is tenderness. There is guarding. There is no rebound.  Neurological: She is alert and oriented to person, place, and time. No cranial nerve deficit.  Skin: Skin is warm and dry.  Psychiatric: She has a normal mood and affect. Her behavior is normal.    MAU Course  Procedures   Labs and EKG performed.   MDM 27 year old 500P0 female presents with a chief complaint of syncope that occurred earlier this morning. She has no cardiac or neurologic risk factors and her pregnancy test from today is  negative.     Assessment and Plan   1. Anemia  2. Dehydration   3. Negative pregnancy test   Patient was advised to increase her iron and oral fluid intake. She was briefly educated on iron-containing foods. She was also instructed to follow up with her PCP for further care as needed for her current condition. She was discharged home.   Dorrene GermanElizabeth Lakena Sparlin 11/28/2017, 9:37 AM

## 2017-11-30 ENCOUNTER — Telehealth: Payer: Self-pay | Admitting: Medical

## 2017-11-30 ENCOUNTER — Encounter: Payer: Self-pay | Admitting: Medical

## 2017-11-30 ENCOUNTER — Ambulatory Visit: Payer: BLUE CROSS/BLUE SHIELD | Admitting: Medical

## 2017-11-30 VITALS — BP 119/77 | HR 74 | Temp 98.5°F | Resp 16 | Ht 67.0 in | Wt 358.0 lb

## 2017-11-30 DIAGNOSIS — R82998 Other abnormal findings in urine: Secondary | ICD-10-CM

## 2017-11-30 DIAGNOSIS — R110 Nausea: Secondary | ICD-10-CM

## 2017-11-30 DIAGNOSIS — Z3201 Encounter for pregnancy test, result positive: Secondary | ICD-10-CM

## 2017-11-30 DIAGNOSIS — R109 Unspecified abdominal pain: Secondary | ICD-10-CM

## 2017-11-30 DIAGNOSIS — D649 Anemia, unspecified: Secondary | ICD-10-CM

## 2017-11-30 DIAGNOSIS — Z3490 Encounter for supervision of normal pregnancy, unspecified, unspecified trimester: Secondary | ICD-10-CM

## 2017-11-30 DIAGNOSIS — R319 Hematuria, unspecified: Secondary | ICD-10-CM

## 2017-11-30 LAB — CBC WITH DIFFERENTIAL/PLATELET
BASOS ABS: 0 10*3/uL (ref 0.0–0.1)
Basophils Relative: 1 % (ref 0.0–3.0)
Eosinophils Absolute: 0.1 10*3/uL (ref 0.0–0.7)
Eosinophils Relative: 2.6 % (ref 0.0–5.0)
HEMATOCRIT: 34.2 % — AB (ref 36.0–46.0)
Hemoglobin: 11.2 g/dL — ABNORMAL LOW (ref 12.0–15.0)
LYMPHS ABS: 1.8 10*3/uL (ref 0.7–4.0)
LYMPHS PCT: 42.4 % (ref 12.0–46.0)
MCHC: 32.6 g/dL (ref 30.0–36.0)
MCV: 82.8 fl (ref 78.0–100.0)
MONOS PCT: 8.9 % (ref 3.0–12.0)
Monocytes Absolute: 0.4 10*3/uL (ref 0.1–1.0)
NEUTROS PCT: 45.1 % (ref 43.0–77.0)
Neutro Abs: 2 10*3/uL (ref 1.4–7.7)
PLATELETS: 257 10*3/uL (ref 150.0–400.0)
RBC: 4.14 Mil/uL (ref 3.87–5.11)
RDW: 16.5 % — ABNORMAL HIGH (ref 11.5–15.5)
WBC: 4.3 10*3/uL (ref 4.0–10.5)

## 2017-11-30 LAB — LIPASE: Lipase: 8 U/L — ABNORMAL LOW (ref 11.0–59.0)

## 2017-11-30 LAB — POC URINALSYSI DIPSTICK (AUTOMATED)
Bilirubin, UA: NEGATIVE
Glucose, UA: NEGATIVE
Ketones, UA: NEGATIVE
Leukocytes, UA: NEGATIVE
NITRITE UA: NEGATIVE
PH UA: 6 (ref 5.0–8.0)
PROTEIN UA: NEGATIVE
Spec Grav, UA: 1.02 (ref 1.010–1.025)
UROBILINOGEN UA: NEGATIVE U/dL — AB

## 2017-11-30 LAB — IRON,TIBC AND FERRITIN PANEL
%SAT: 13 % (calc) — ABNORMAL LOW (ref 16–45)
FERRITIN: 11 ng/mL — AB (ref 16–154)
IRON: 49 ug/dL (ref 40–190)
TIBC: 370 mcg/dL (calc) (ref 250–450)

## 2017-11-30 LAB — COMPREHENSIVE METABOLIC PANEL
ALK PHOS: 78 U/L (ref 39–117)
ALT: 18 U/L (ref 0–35)
AST: 18 U/L (ref 0–37)
Albumin: 3.9 g/dL (ref 3.5–5.2)
BILIRUBIN TOTAL: 0.4 mg/dL (ref 0.2–1.2)
BUN: 8 mg/dL (ref 6–23)
CO2: 29 meq/L (ref 19–32)
CREATININE: 0.82 mg/dL (ref 0.40–1.20)
Calcium: 9.1 mg/dL (ref 8.4–10.5)
Chloride: 104 mEq/L (ref 96–112)
GFR: 108.45 mL/min (ref >60.00)
GLUCOSE: 81 mg/dL (ref 70–99)
Potassium: 4.2 mEq/L (ref 3.5–5.1)
Sodium: 138 mEq/L (ref 135–145)
TOTAL PROTEIN: 7.1 g/dL (ref 6.0–8.3)

## 2017-11-30 LAB — AMYLASE: Amylase: 54 U/L (ref 27–131)

## 2017-11-30 LAB — HCG, QUANTITATIVE, PREGNANCY: QUANTITATIVE HCG: 22.62 m[IU]/mL

## 2017-11-30 LAB — POCT URINE PREGNANCY: PREG TEST UR: POSITIVE — AB

## 2017-11-30 NOTE — Telephone Encounter (Signed)
Pt needs appointment for hcg on this coming Monday. Need to compare Friday hcg result to this coming  Monday. Can you help coordinate with lab for her to be get placed on schedule and notify pt of appointment. Lab does typically want advanced scheduling but test still needs to be done on Monday.

## 2017-11-30 NOTE — Telephone Encounter (Signed)
Opened to review 

## 2017-11-30 NOTE — Patient Instructions (Addendum)
For your recent history of positive pregnancy test we will get serum quantitative hCG and assess level.  Recent history is a bit confusing as you had 5+ pregnancy tests over the past week but 2 days [facility].  After review of quantitative hCG results may consider getting pelvic-OB ultrasound or trying to get you appointment relatively quick with obstetrician next week.   For recent left upper quadrant abdomen pain that is intermittent, I placed order for CBC, CMP, amylase and lipase.  Some indication of possible UTI on POCT urine but you have no UTI symptoms. So will get culture.  History of anemia and will get iron studies to assess if iron deficient.  Also follow CBC result.  Follow-up in 1 week or as needed.

## 2017-11-30 NOTE — Progress Notes (Signed)
Subjective:    Patient ID: Brooke Russell, female    DOB: 1991/03/31, 27 y.o.   MRN: [id number]  HPI   Pt in for recent irregular mentrual cycles. She states her last normal type menstrual in April. Pt has hx of menometrorrhagia.(describes historical pattern of skipping varius months then when has menses will last 14 days).  She explains on fathers day had one day light bleedig day then stopped.  Then she explain on July 1st, 2nd and 3rd this month had some menstrual type bleeding for 3 days. None since.  Pt has some nauseau and light headed sensation intermittent   Pt just recently did 5 pregnancy test that were positive. But hospital test 2 days ago urine preg was negative. Pt first positive test of series was on past Sunday.  Our test today states positive.   Reviewed pt syncope evaluation the other day in ED.    Review of Systems  Constitutional: Negative for chills, fatigue and fever.  Respiratory: Negative for cough, choking and wheezing.   Cardiovascular: Negative for chest pain and palpitations.  Gastrointestinal: Positive for abdominal pain.       Patient has been having some luq intermittent pain since end of last month. No pain associated with eating. Comes and goes. Intensity varies. Highest level 5/10.   Genitourinary: Negative for difficulty urinating, dysuria, flank pain, frequency, hematuria and urgency.  Musculoskeletal: Negative for back pain, joint swelling and neck stiffness.  Skin: Negative for rash.  Neurological: Negative for dizziness, speech difficulty, weakness and headaches.  Hematological: Negative for adenopathy. Does not bruise/bleed easily.  Psychiatric/Behavioral: Negative for behavioral problems, confusion, dysphoric mood and sleep disturbance. The patient is not nervous/anxious.     Past Medical History:  Diagnosis Date  . Allergy    spring.  . Asthma   . History of chicken pox   . Irregular menses      Social History   Socioeconomic  History  . Marital status: Single    Spouse name: Not on file  . Number of children: Not on file  . Years of education: Not on file  . Highest education level: Not on file  Occupational History  . Not on file  Social Needs  . Financial resource strain: Not on file  . Food insecurity:    Worry: Not on file    Inability: Not on file  . Transportation needs:    Medical: Not on file    Non-medical: Not on file  Tobacco Use  . Smoking status: Never Smoker  . Smokeless tobacco: Never Used  Substance and Sexual Activity  . Alcohol use: Yes    Comment: very rare  . Drug use: No  . Sexual activity: Yes    Birth control/protection: Abstinence    Comment: 1ST INTERCOURSE- 1023, PARTNERS- 1 . in past active but presently abstinence.  Lifestyle  . Physical activity:    Days per week: Not on file    Minutes per session: Not on file  . Stress: Not on file  Relationships  . Social connections:    Talks on phone: Not on file    Gets together: Not on file    Attends religious service: Not on file    Active member of club or organization: Not on file    Attends meetings of clubs or organizations: Not on file    Relationship status: Not on file  . Intimate partner violence:    Fear of current or ex partner: Not on file  Emotionally abused: Not on file    Physically abused: Not on file    Forced sexual activity: Not on file  Other Topics Concern  . Not on file  Social History Narrative  . Not on file    Past Surgical History:  Procedure Laterality Date  . NO PAST SURGERIES      Family History  Problem Relation Age of Onset  . Hypertension Mother   . Healthy Father   . Healthy Sister   . Healthy Brother   . Diabetes Maternal Grandmother        MGGM  . Ovarian cancer Maternal Grandmother        MGGM  . Alcohol abuse Neg Hx   . Arthritis Neg Hx   . Asthma Neg Hx   . Birth defects Neg Hx   . Cancer Neg Hx   . COPD Neg Hx   . Depression Neg Hx   . Drug abuse Neg Hx   .  Early death Neg Hx   . Hearing loss Neg Hx   . Heart disease Neg Hx   . Hyperlipidemia Neg Hx   . Kidney disease Neg Hx   . Learning disabilities Neg Hx   . Mental illness Neg Hx   . Mental retardation Neg Hx   . Miscarriages / Stillbirths Neg Hx   . Stroke Neg Hx   . Vision loss Neg Hx   . Varicose Veins Neg Hx     Allergies  Allergen Reactions  . Almond (Diagnostic) Nausea And Vomiting    Almond Milk    Current Outpatient Medications on File Prior to Visit  Medication Sig Dispense Refill  . metroNIDAZOLE (FLAGYL) 500 MG tablet Take 1 tablet (500 mg total) by mouth 3 (three) times daily. 21 tablet 0  . norgestrel-ethinyl estradiol (LO/OVRAL,CRYSELLE) 0.3-30 MG-MCG tablet Take 1 tablet by mouth daily. 3 Package 4   No current facility-administered medications on file prior to visit.     Pulse 74   Temp 98.5 F (36.9 C) (Oral)   Resp 16   Ht 5\' 7"  (1.702 m)   Wt (!) 358 lb (162.4 kg)   LMP 11/19/2017   SpO2 100%   BMI 56.07 kg/m       Objective:   Physical Exam  General Mental Status- Alert. General Appearance- Not in acute distress.   Skin General: Color- Normal Color. Moisture- Normal Moisture.  Neck Carotid Arteries- Normal color. Moisture- Normal Moisture. No carotid bruits. No JVD.  Chest and Lung Exam Auscultation: Breath Sounds:-Normal.  Cardiovascular Auscultation:Rythm- Regular. Murmurs & Other Heart Sounds:Auscultation of the heart reveals- No Murmurs.  Abdomen Inspection:-Inspeection Normal. Palpation/Percussion:Note:No mass. Palpation and Percussion of the abdomen reveal- Non Tender, Non Distended + BS, no rebound or guarding.    Neurologic Cranial Nerve exam:- CN III-XII intact(No nystagmus), symmetric smile. Strength:- 5/5 equal and symmetric strength both upper and lower extremities.      Assessment & Plan:  For your recent history of positive pregnancy test we will get serum quantitative hCG and assess level.  Recent history is  a bit confusing as you had 5+ pregnancy tests over the past week but [facility].  After review of quantitative hCG results may consider getting pelvic-OB ultrasound or trying to get you appointment relatively quick with obstetrician next week.   For recent left upper quadrant abdomen pain that is intermittent, I placed order for CBC, CMP, amylase and lipase.  Some indication  of possible UTI on POCT urine but you have no UTI symptoms. So will get culture.  History of anemia and will get iron studies to assess if iron deficient.  Also follow CBC result.  Follow-up in 1 week or as needed.  Esperanza Richters, PA-C

## 2017-12-01 LAB — URINE CULTURE
MICRO NUMBER:: 90828175
Result:: NO GROWTH
SPECIMEN QUALITY:: ADEQUATE

## 2017-12-03 ENCOUNTER — Other Ambulatory Visit (INDEPENDENT_AMBULATORY_CARE_PROVIDER_SITE_OTHER): Payer: BLUE CROSS/BLUE SHIELD

## 2017-12-03 ENCOUNTER — Encounter: Payer: Self-pay | Admitting: Medical

## 2017-12-03 DIAGNOSIS — Z3201 Encounter for pregnancy test, result positive: Secondary | ICD-10-CM

## 2017-12-03 LAB — HCG, QUANTITATIVE, PREGNANCY: QUANTITATIVE HCG: 7.53 m[IU]/mL

## 2017-12-03 NOTE — Telephone Encounter (Signed)
Pt on lab schedule

## 2017-12-05 ENCOUNTER — Encounter: Payer: Self-pay | Admitting: Women's Health

## 2017-12-05 ENCOUNTER — Ambulatory Visit: Payer: BLUE CROSS/BLUE SHIELD | Admitting: Women's Health

## 2017-12-05 ENCOUNTER — Ambulatory Visit: Payer: BLUE CROSS/BLUE SHIELD | Admitting: Medical

## 2017-12-05 VITALS — BP 128/80 | Ht 66.5 in | Wt 359.8 lb

## 2017-12-05 DIAGNOSIS — N912 Amenorrhea, unspecified: Secondary | ICD-10-CM | POA: Diagnosis not present

## 2017-12-05 DIAGNOSIS — O039 Complete or unspecified spontaneous abortion without complication: Secondary | ICD-10-CM

## 2017-12-05 NOTE — Progress Notes (Signed)
27 year old SBF presents for follow-up positive  UPT, was seen at hospital on 09/28/2017 negative UPT, Primary care 11/30/2017 quant 22.62, quant 7.5 on 12/03/2017.  Reports 5+ home UPT's last week.  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  States had a cycle early part of May that lasted 2 weeks, 1 day of bleeding June 16.  Has had heartburn, which prompted home UPT's, left  abdominal pain lateral to umbilicus, cramping sensation for the past week. Denies urinary symptoms, vaginal discharge, spotting, urinary symptoms or constipation.  History of irregular cycles, using no contraception, same partner with negative STD screen.  Exam: Appears well, morbid obesity, sad concerning loss of pregnancy.  Abdomen soft without rebound or radiation, pain is left lateral abdomen,  external genitalia within normal limits, speculum exam no visible blood or discharge, bimanual no CMT or adnexal tenderness pain mid abdominal left of umbilicus   Early pregnancy loss  Plan: Quantitative hCG, reviewed most likely early miscarriage, will keep menstrual calendar, and if no cycle within 4 to 6 weeks instructed to call office.  Declines contraception at this time.

## 2017-12-06 ENCOUNTER — Encounter (INDEPENDENT_AMBULATORY_CARE_PROVIDER_SITE_OTHER): Payer: Self-pay

## 2017-12-06 LAB — HCG, QUANTITATIVE, PREGNANCY: HCG, TOTAL, QN: 4 m[IU]/mL

## 2017-12-07 ENCOUNTER — Encounter (INDEPENDENT_AMBULATORY_CARE_PROVIDER_SITE_OTHER): Payer: Self-pay

## 2017-12-10 ENCOUNTER — Encounter (INDEPENDENT_AMBULATORY_CARE_PROVIDER_SITE_OTHER): Payer: Self-pay

## 2018-02-22 ENCOUNTER — Ambulatory Visit: Payer: BLUE CROSS/BLUE SHIELD | Admitting: Medical

## 2018-02-22 ENCOUNTER — Other Ambulatory Visit (HOSPITAL_COMMUNITY)
Admission: RE | Admit: 2018-02-22 | Discharge: 2018-02-22 | Disposition: A | Discharge status: Home / Self Care | Payer: BLUE CROSS/BLUE SHIELD | Source: Ambulatory Visit | Attending: Medical | Admitting: Medical

## 2018-02-22 ENCOUNTER — Encounter: Payer: Self-pay | Admitting: Medical

## 2018-02-22 VITALS — BP 124/72 | HR 80 | Temp 98.3°F | Resp 16 | Ht 66.5 in | Wt 366.2 lb

## 2018-02-22 DIAGNOSIS — Z113 Encounter for screening for infections with a predominantly sexual mode of transmission: Secondary | ICD-10-CM | POA: Diagnosis not present

## 2018-02-22 DIAGNOSIS — R600 Localized edema: Secondary | ICD-10-CM | POA: Diagnosis not present

## 2018-02-22 DIAGNOSIS — L089 Local infection of the skin and subcutaneous tissue, unspecified: Secondary | ICD-10-CM

## 2018-02-22 DIAGNOSIS — M79609 Pain in unspecified limb: Secondary | ICD-10-CM | POA: Diagnosis not present

## 2018-02-22 DIAGNOSIS — N898 Other specified noninflammatory disorders of vagina: Secondary | ICD-10-CM | POA: Insufficient documentation

## 2018-02-22 DIAGNOSIS — L732 Hidradenitis suppurativa: Secondary | ICD-10-CM

## 2018-02-22 MED ORDER — DOXYCYCLINE HYCLATE 100 MG PO TABS: 100.0000 mg | ORAL_TABLET | Freq: Two times a day (BID) | ORAL | 0 refills | Status: AC

## 2018-02-22 MED ORDER — FLUCONAZOLE 150 MG PO TABS: ORAL_TABLET | ORAL | 0 refills | Status: AC

## 2018-02-22 NOTE — Patient Instructions (Addendum)
You do appear to have left axillary hidradenitis suppurativa with a skin infection.  On review of the area it does not appear that abscess is formed.  Will give doxycycline to take for the next 10 days.  Rx advisement given.   If the area does form abscess then recommend ED evaluation for I&D.  For recent pedal edema, I do think this represents dependent edema as swelling coincided with working on your feet with 2 jobs.  Do recommend that you do TED hose compression stockings.  On the right side you do have some faint popliteal region pain so I did place order to get right lower extremity ultrasound.  You are scheduled to have that done tomorrow at 11:30 AM.  If you have worse pain during the interim or shortness of breath and recommend ED evaluation.  Prescription of Diflucan given to take as directed for potential yeast infection.  Also sending out urine ancillary studies.  Follow-up in 7 to 10 days or as needed.

## 2018-02-22 NOTE — Progress Notes (Signed)
Subjective:    Patient ID: Brooke Russell, female    DOB: [birth year], 27 y.o.   MRN: [id number]  HPI  Pt in for follow up.  She has some pain in left mid axillary area for 2 weeks. The no fever, no chills or sweats. Pt denies any history of h supprativa.  Pain is moderate.She tried to use warm compresses to area but did not help.   Pt states has bilateral ankle and ankle  region swelling for 2 weeks. No trauma and no fall. Pt has been working 2 jobs over past 6 weeks. Some tightness popliteal area rt side.  LMP- Sept 18,2019.  Also recent vaginal itching for about a week. She expressed concern to do ancillary studies. No dc reported.  Review of Systems  Constitutional: Negative for chills, fatigue and fever.  Respiratory: Negative for cough, chest tightness, shortness of breath and wheezing.   Cardiovascular: Negative for chest pain and palpitations.  Gastrointestinal: Negative for abdominal pain.  Musculoskeletal:       See hpi/left axillary exam.  See exam lower ext.  Neurological: Negative for dizziness and headaches.  Hematological: Negative for adenopathy. Does not bruise/bleed easily.  Psychiatric/Behavioral: Negative for behavioral problems and confusion.   Past Medical History:  Diagnosis Date  . Allergy    spring.  . Asthma   . History of chicken pox   . Irregular menses      Social History   Socioeconomic History  . Marital status: Single    Spouse name: Not on file  . Number of children: Not on file  . Years of education: Not on file  . Highest education level: Not on file  Occupational History  . Not on file  Social Needs  . Financial resource strain: Not on file  . Food insecurity:    Worry: Not on file    Inability: Not on file  . Transportation needs:    Medical: Not on file    Non-medical: Not on file  Tobacco Use  . Smoking status: Never Smoker  . Smokeless tobacco: Never Used  Substance and Sexual Activity  . Alcohol use: Yes    Comment:  very rare  . Drug use: No  . Sexual activity: Yes    Birth control/protection: Abstinence    Comment: 1ST INTERCOURSE- 89, PARTNERS- 1 . in past active but presently abstinence.  Lifestyle  . Physical activity:    Days per week: Not on file    Minutes per session: Not on file  . Stress: Not on file  Relationships  . Social connections:    Talks on phone: Not on file    Gets together: Not on file    Attends religious service: Not on file    Active member of club or organization: Not on file    Attends meetings of clubs or organizations: Not on file    Relationship status: Not on file  . Intimate partner violence:    Fear of current or ex partner: Not on file    Emotionally abused: Not on file    Physically abused: Not on file    Forced sexual activity: Not on file  Other Topics Concern  . Not on file  Social History Narrative  . Not on file    Past Surgical History:  Procedure Laterality Date  . NO PAST SURGERIES      Family History  Problem Relation Age of Onset  . Hypertension Mother   . Healthy Father   .  Healthy Sister   . Healthy Brother   . Diabetes Maternal Grandmother        MGGM  . Ovarian cancer Maternal Grandmother        MGGM  . Alcohol abuse Neg Hx   . Arthritis Neg Hx   . Asthma Neg Hx   . Birth defects Neg Hx   . Cancer Neg Hx   . COPD Neg Hx   . Depression Neg Hx   . Drug abuse Neg Hx   . Early death Neg Hx   . Hearing loss Neg Hx   . Heart disease Neg Hx   . Hyperlipidemia Neg Hx   . Kidney disease Neg Hx   . Learning disabilities Neg Hx   . Mental illness Neg Hx   . Mental retardation Neg Hx   . Miscarriages / Stillbirths Neg Hx   . Stroke Neg Hx   . Vision loss Neg Hx   . Varicose Veins Neg Hx     Allergies  Allergen Reactions  . Almond (Diagnostic) Nausea And Vomiting    Almond Milk    No current outpatient medications on file prior to visit.   No current facility-administered medications on file prior to visit.     BP  124/72   Pulse 80   Temp 98.3 F (36.8 C) (Oral)   Resp 16   Ht 5' 6.5" (1.689 m)   Wt (!) 366 lb 3.2 oz (166.1 kg)   SpO2 100%   BMI 58.22 kg/m       Objective:   Physical Exam  General- No acute distress. Pleasant patient. Neck- Full range of motion, no jvd Lungs- Clear, even and unlabored. Heart- regular rate and rhythm. Neurologic- CNII- XII grossly intact.  Left axillary area- mid aspect of axillary shows small indurated area at center. Area is tender but not warm and no fluctuance. Left upper ext- normal exam  Lower ext- mild swelling of both ankles. Calfs appears symmetric. Negative homans signs left side. Rt side mild + homans sign.       Assessment & Plan:  You do appear to have left axillary hidradenitis suppurativa with a skin infection.  On review of the area it does not appear that abscess is formed.  Will give doxycycline to take for the next 10 days.  Rx advisement given.   If the area does form abscess then recommend ED evaluation for I&D.  For recent pedal edema, I do think this represents dependent edema as swelling coincided with working on your feet with 2 jobs.  Do recommend that you do TED hose compression stockings.  On the right side you do have some faint popliteal region pain so I did place order to get right lower extremity ultrasound.  You are scheduled to have that done tomorrow at 11:30 AM.  If you have worse pain during the interim or shortness of breath and recommend ED evaluation.  Prescription of Diflucan given to take as directed for potential yeast infection.  Also sending out urine ancillary studies.  Follow-up in 7 to 10 days or as needed.  Rx advisment explained in detail regarding doxy. Pt states not sexually active since august. preg test negative. Rx advisement explained.  Esperanza Richters, PA-C

## 2018-02-23 ENCOUNTER — Ambulatory Visit (HOSPITAL_BASED_OUTPATIENT_CLINIC_OR_DEPARTMENT_OTHER): Payer: BLUE CROSS/BLUE SHIELD

## 2018-02-25 ENCOUNTER — Ambulatory Visit (HOSPITAL_BASED_OUTPATIENT_CLINIC_OR_DEPARTMENT_OTHER)
Admission: RE | Admit: 2018-02-25 | Discharge: 2018-02-25 | Disposition: A | Discharge status: Home / Self Care | Payer: BLUE CROSS/BLUE SHIELD | Source: Ambulatory Visit | Attending: Medical | Admitting: Medical

## 2018-02-25 DIAGNOSIS — M79609 Pain in unspecified limb: Secondary | ICD-10-CM | POA: Diagnosis present

## 2018-02-25 DIAGNOSIS — R600 Localized edema: Secondary | ICD-10-CM | POA: Diagnosis present

## 2018-02-25 LAB — URINE CYTOLOGY ANCILLARY ONLY
CHLAMYDIA, DNA PROBE: NEGATIVE
Neisseria Gonorrhea: NEGATIVE
Trichomonas: NEGATIVE

## 2018-02-26 LAB — URINE CYTOLOGY ANCILLARY ONLY: Candida vaginitis: NEGATIVE

## 2018-02-27 ENCOUNTER — Telehealth: Payer: Self-pay | Admitting: Medical

## 2018-02-27 ENCOUNTER — Encounter: Payer: Self-pay | Admitting: Medical

## 2018-02-27 MED ORDER — METRONIDAZOLE 500 MG PO TABS: 500.0000 mg | ORAL_TABLET | Freq: Three times a day (TID) | ORAL | 0 refills | Status: AC

## 2018-02-27 NOTE — Telephone Encounter (Signed)
rx flagyl sent to pt pharmacy 

## 2018-03-30 ENCOUNTER — Inpatient Hospital Stay (HOSPITAL_COMMUNITY)
Admission: AD | Admit: 2018-03-30 | Discharge: 2018-03-30 | Disposition: A | Discharge status: Home / Self Care | Payer: BLUE CROSS/BLUE SHIELD | Attending: Obstetrics and Gynecology | Admitting: Obstetrics and Gynecology

## 2018-03-30 ENCOUNTER — Encounter: Payer: Self-pay | Admitting: Medical

## 2018-03-30 ENCOUNTER — Encounter (HOSPITAL_COMMUNITY): Payer: Self-pay | Admitting: *Deleted

## 2018-03-30 ENCOUNTER — Other Ambulatory Visit: Payer: Self-pay | Admitting: Medical

## 2018-03-30 DIAGNOSIS — B3731 Acute candidiasis of vulva and vagina: Secondary | ICD-10-CM

## 2018-03-30 DIAGNOSIS — B373 Candidiasis of vulva and vagina: Secondary | ICD-10-CM | POA: Diagnosis not present

## 2018-03-30 DIAGNOSIS — R102 Pelvic and perineal pain: Secondary | ICD-10-CM | POA: Insufficient documentation

## 2018-03-30 LAB — URINALYSIS, ROUTINE W REFLEX MICROSCOPIC
Bilirubin Urine: NEGATIVE
Glucose, UA: NEGATIVE mg/dL
HGB URINE DIPSTICK: NEGATIVE
Ketones, ur: NEGATIVE mg/dL
Nitrite: NEGATIVE
PROTEIN: NEGATIVE mg/dL
Specific Gravity, Urine: 1.028 (ref 1.005–1.030)
pH: 5 (ref 5.0–8.0)

## 2018-03-30 LAB — WET PREP, GENITAL
SPERM: NONE SEEN
TRICH WET PREP: NONE SEEN

## 2018-03-30 LAB — POCT PREGNANCY, URINE: Preg Test, Ur: NEGATIVE

## 2018-03-30 MED ORDER — FLUCONAZOLE 150 MG PO TABS
150.0000 mg | ORAL_TABLET | Freq: Once | ORAL | Status: AC
  Administered 2018-03-30: 150 mg via ORAL
  Filled 2018-03-30: qty 1

## 2018-03-30 NOTE — MAU Note (Signed)
Pt just finished Flagyl yesterday for BV. Continues to have vaginal irritation and burning. Clear vag d/c. No itching. Took partial dose of Doxycycline for cyst on L axilla which Misenheimer HP prescribed. Two days later started Flagyl for BV.

## 2018-03-30 NOTE — MAU Provider Note (Signed)
Chief Complaint:  Vaginal Pain   First Provider Initiated Contact with Patient 03/30/18 0214       HPI: Brooke Russell is a 27 y.o. [id number] who presents to maternity admissions reporting vaginal irritation and burning.  Just finished full Rx of Flagyl for Bacterial vaginosis and a partial Rx of Doxycycline for abscess.  Was given Rx for Diflucan but did not fill it. . She reports no vaginal bleeding, urinary symptoms, h/a, dizziness, n/v, or fever/chills.    HPI RN Note: Pt just finished Flagyl yesterday for BV. Continues to have vaginal irritation and burning. Clear vag d/c. No itching. Took partial dose of Doxycycline for cyst on L axilla which Vance HP prescribed. Two days later started Flagyl for BV.   Past Medical History: Past Medical History:  Diagnosis Date  . Allergy    spring.  . Asthma   . History of chicken pox   . Irregular menses     Past obstetric history: OB History  Gravida Para Term Preterm AB Living  1       1    SAB TAB Ectopic Multiple Live Births  1            # Outcome Date GA Lbr Len/2nd Weight Sex Delivery Anes PTL Lv  1 SAB 2019            Past Surgical History: Past Surgical History:  Procedure Laterality Date  . NO PAST SURGERIES      Family History: Family History  Problem Relation Age of Onset  . Hypertension Mother   . Healthy Father   . Healthy Sister   . Healthy Brother   . Diabetes Maternal Grandmother        MGGM  . Ovarian cancer Maternal Grandmother        MGGM  . Alcohol abuse Neg Hx   . Arthritis Neg Hx   . Asthma Neg Hx   . Birth defects Neg Hx   . Cancer Neg Hx   . COPD Neg Hx   . Depression Neg Hx   . Drug abuse Neg Hx   . Early death Neg Hx   . Hearing loss Neg Hx   . Heart disease Neg Hx   . Hyperlipidemia Neg Hx   . Kidney disease Neg Hx   . Learning disabilities Neg Hx   . Mental illness Neg Hx   . Mental retardation Neg Hx   . Miscarriages / Stillbirths Neg Hx   . Stroke Neg Hx   . Vision loss Neg Hx    . Varicose Veins Neg Hx     Social History: Social History   Tobacco Use  . Smoking status: Never Smoker  . Smokeless tobacco: Never Used  Substance Use Topics  . Alcohol use: Yes    Comment: very rare  . Drug use: No    Allergies:  Allergies  Allergen Reactions  . Almond (Diagnostic) Nausea And Vomiting    Almond Milk    Meds:  Medications Prior to Admission  Medication Sig Dispense Refill Last Dose  . doxycycline (VIBRA-TABS) 100 MG tablet Take 1 tablet (100 mg total) by mouth 2 (two) times daily. Can give caps or generic 20 tablet 0 Past Week at Unknown time  . metroNIDAZOLE (FLAGYL) 500 MG tablet Take 1 tablet (500 mg total) by mouth 3 (three) times daily. 21 tablet 0 03/29/2018 at Unknown time  . fluconazole (DIFLUCAN) 150 MG tablet 1 tab po day 1, 1 po day 5  and 1 tab po day 10 3 tablet 0     I have reviewed patient's Past Medical Hx, Surgical Hx, Family Hx, Social Hx, medications and allergies.  ROS:  Review of Systems  Constitutional: Negative for chills and fever.  Respiratory: Negative for shortness of breath.   Gastrointestinal: Negative for abdominal pain, constipation and diarrhea.  Genitourinary: Positive for vaginal discharge. Negative for difficulty urinating, genital sores, pelvic pain, vaginal bleeding and vaginal pain (vag irritation).   Other systems negative    Physical Exam   Patient Vitals for the past 24 hrs:  BP Temp Pulse Resp Height Weight  03/30/18 0022 134/80 98.4 F (36.9 C) 88 18 5\' 7"  (1.702 m) (!) 169.2 kg   Constitutional: Well-developed, well-nourished female in no acute distress.  Cardiovascular: normal rate and rhythm Respiratory: normal effort, no distress.  GI: Abd soft, non-tender.  Nondistended.  No rebound, No guarding.   MS: Extremities nontender, no edema, normal ROM Neurologic: Alert and oriented x 4.   Grossly nonfocal. GU: Neg CVAT. Skin:  Warm and Dry Psych:  Affect appropriate.  PELVIC EXAM: Cervix pink,  visually closed, without lesion, scant white creamy discharge, vaginal walls and external genitalia normal   Labs: Results for orders placed or performed during the hospital encounter of 03/30/18 (from the past 24 hour(s))  Urinalysis, Routine w reflex microscopic     Status: Abnormal   Collection Time: 03/30/18 12:30 AM  Result Value Ref Range   Color, Urine YELLOW YELLOW   APPearance HAZY (A) CLEAR   Specific Gravity, Urine 1.028 1.005 - 1.030   pH 5.0 5.0 - 8.0   Glucose, UA NEGATIVE NEGATIVE mg/dL   Hgb urine dipstick NEGATIVE NEGATIVE   Bilirubin Urine NEGATIVE NEGATIVE   Ketones, ur NEGATIVE NEGATIVE mg/dL   Protein, ur NEGATIVE NEGATIVE mg/dL   Nitrite NEGATIVE NEGATIVE   Leukocytes, UA MODERATE (A) NEGATIVE   RBC / HPF 6-10 0 - 5 RBC/hpf   WBC, UA 11-20 0 - 5 WBC/hpf   Bacteria, UA RARE (A) NONE SEEN   Squamous Epithelial / LPF 6-10 0 - 5   Mucus PRESENT   Pregnancy, urine POC     Status: None   Collection Time: 03/30/18 12:42 AM  Result Value Ref Range   Preg Test, Ur NEGATIVE NEGATIVE  Wet prep, genital     Status: Abnormal   Collection Time: 03/30/18  2:28 AM  Result Value Ref Range   Yeast Wet Prep HPF POC PRESENT (A) NONE SEEN   Trich, Wet Prep NONE SEEN NONE SEEN   Clue Cells Wet Prep HPF POC PRESENT (A) NONE SEEN   WBC, Wet Prep HPF POC MODERATE (A) NONE SEEN   Sperm NONE SEEN     Imaging:  No results found.  MAU Course/MDM: I have ordered labs as follows:  Wet prep which confirmed yeast vaginitis Imaging ordered: none Results reviewed. Discussed need to treat with Diflucan and start Probiotics  First dose given   Has rx in pharmacy for Diflucan if needed.   Treatments in MAU included Diflucan.   Pt stable at time of discharge.  Assessment: Yeast vaginitis  Plan: Discharge home Recommend Start combination probiotic (vaginal and GI) Follow up at GYN office Encouraged to return here or to other Urgent Care/ED if she develops worsening of symptoms,  increase in pain, fever, or other concerning symptoms.   Wynelle Bourgeois CNM, MSN Certified Nurse-Midwife 03/30/2018 2:14 AM

## 2018-03-30 NOTE — Discharge Instructions (Signed)
Probiotics What are probiotics? Probiotics are the good bacteria and yeasts that live in your body and keep you and your digestive system healthy. Probiotics also help your body's defense (immune) system and protect your body against bad bacterial growth. Certain foods contain probiotics, such as yogurt. Probiotics can also be purchased as a supplement. As with any supplement or drug, it is important to discuss its use with your health care provider. What affects the balance of bacteria in my body? The balance of bacteria in your body can be affected by:  Antibiotic medicines. Antibiotics are sometimes necessary to treat infection. Unfortunately, they may kill good or friendly bacteria in your body as well as the bad bacteria. This may lead to stomach problems like diarrhea, gas, and cramping.  Disease. Some conditions are the result of an overgrowth of bad bacteria, yeasts, parasites, or fungi. These conditions include: ? Infectious diarrhea. ? Stomach and respiratory infections. ? Skin infections. ? Irritable bowel syndrome (IBS). ? Inflammatory bowel diseases. ? Ulcer due to Helicobacter pylori (H. pylori) infection. ? Tooth decay and periodontal disease. ? Vaginal infections.  Stress and poor diet may also lower the good bacteria in your body. What type of probiotic is right for me? Probiotics are available over the counter at your local pharmacy, health food, or grocery store. They come in many different forms, combinations of strains, and dosing strengths. Some may need to be refrigerated. Always read the label for storage and usage instructions. Specific strains have been shown to be more effective for certain conditions. Ask your health care provider what option is best for you. Why would I need probiotics? There are many reasons your health care provider might recommend a probiotic supplement, including:  Diarrhea.  Constipation.  IBS.  Respiratory infections.  Yeast  infections.  Acne, eczema, and other skin conditions.  Frequent urinary tract infections (UTIs).  Are there side effects of probiotics? Some people experience mild side effects when taking probiotics. Side effects are usually temporary and may include:  Gas.  Bloating.  Cramping.  Rarely, serious side effects, such as infection or immune system changes, may occur. What else do I need to know about probiotics?  There are many different strains of probiotics. Certain strains may be more effective depending on your condition. Probiotics are available in varying doses. Ask your health care provider which probiotic you should use and how often.  If you are taking probiotics along with antibiotics, it is generally recommended to wait at least 2 hours between taking the antibiotic and taking the probiotic. For more information: [facility] http://potts.com/http://nccam.nih.gov/ This information is not intended to replace advice given to you by your health care provider. Make sure you discuss any questions you have with your health care provider. Document Released: 12/03/2013 Document Revised: 04/04/2016 Document Reviewed: 08/05/2013 Elsevier Interactive Patient Education  2017 Else[employer] Inc.   Vaginal Yeast infection, Adult Vaginal yeast infection is a condition that causes soreness, swelling, and redness (inflammation) of the vagina. It also causes vaginal discharge. This is a common condition. Some women get this infection frequently. What are the causes? This condition is caused by a change in the normal balance of the yeast (candida) and bacteria that live in the vagina. This change causes an overgrowth of yeast, which causes the inflammation. What increases the risk? This condition is more likely to develop in:  Women who take antibiotic medicines.  Women who have diabetes.  Women who take birth control pills.  Women who are pregnant.  Women who  douche often.  Women who have a weak defense (immune) system.  Women who have been taking steroid medicines for a long time.  Women who frequently wear tight clothing.  What are the signs or symptoms? Symptoms of this condition include:  White, thick vaginal discharge.  Swelling, itching, redness, and irritation of the vagina. The lips of the vagina (vulva) may be affected as well.  Pain or a burning feeling while urinating.  Pain during sex.  How is this diagnosed? This condition is diagnosed with a medical history and physical exam. This will include a pelvic exam. Your health care provider will examine a sample of your vaginal discharge under a microscope. Your health care provider may send this sample for testing to confirm the diagnosis. How is this treated? This condition is treated with medicine. Medicines may be over-the-counter or prescription. You may be told to use one or more of the following:  Medicine that is taken orally.  Medicine that is applied as a cream.  Medicine that is inserted directly into the vagina (suppository).  Follow these instructions at home:  Take or apply over-the-counter and prescription medicines only as told by your health care provider.  Do not have sex until your health care provider has approved. Tell your sex partner that you have a yeast infection. That person should go to his or her health care provider if he or she develops symptoms.  Do not wear tight clothes, such as pantyhose or tight pants.  Avoid using tampons until your health care provider approves.  Eat more yogurt. This may help to keep your yeast infection from returning.  Try taking a sitz bath to help with discomfort. This is a warm water bath that is taken while you are sitting down. The water should only come up to your hips and should cover your buttocks. Do this 3-4 times per day or as told by your health care provider.  Do not douche.  Wear breathable, cotton  underwear.  If you have diabetes, keep your blood sugar levels under control. Contact a health care provider if:  You have a fever.  Your symptoms go away and then return.  Your symptoms do not get better with treatment.  Your symptoms get worse.  You have new symptoms.  You develop blisters in or around your vagina.  You have blood coming from your vagina and it is not your menstrual period.  You develop pain in your abdomen. This information is not intended to replace advice given to you by your health care provider. Make sure you discuss any questions you have with your health care provider. Document Released: 02/15/2005 Document Revised: 10/20/2015 Document Reviewed: 11/09/2014 Elsevier Interactive Patient Education  2018 [employer].

## 2018-03-31 LAB — URINE CULTURE

## 2018-04-01 ENCOUNTER — Telehealth: Payer: Self-pay | Admitting: Medical

## 2018-04-01 MED ORDER — FLUCONAZOLE 150 MG PO TABS: 150.0000 mg | ORAL_TABLET | Freq: Once | ORAL | 0 refills | Status: AC

## 2018-04-01 NOTE — Telephone Encounter (Signed)
rx diflucan sent to pharmacy. 

## 2018-05-31 ENCOUNTER — Ambulatory Visit: Payer: Self-pay

## 2018-05-31 NOTE — Telephone Encounter (Signed)
Patient called in with c/o "difficulty breathing, fever, sore throat." She says "my difficulty breathing started Monday, off and on. I don't have any inhalers, that's why I'm trying to come in today to see Ramon Dredge." I asked about severity and other symptoms, she says "moderate. I am coughing, wheezing, fever 101.6, nasal congestion and chest pain when coughing." According to protocol, see PCP within 4 hours. Advised no availability with PCP, but I can check other providers. She says "if I can't see Mable Fill just go to the urgent care." Patient said thank you and hung up the phone.   Reason for Disposition . [1] Longstanding difficulty breathing (e.g., CHF, COPD, emphysema) AND [2] WORSE than normal  Answer Assessment - Initial Assessment Questions 1. RESPIRATORY STATUS: "Describe your breathing?" (e.g., wheezing, shortness of breath, unable to speak, severe coughing)      Shortness of breath, wheezing, coughing 2. ONSET: "When did this breathing problem begin?"      Monday 3. PATTERN "Does the difficult breathing come and go, or has it been constant since it started?"      Come and go 4. SEVERITY: "How bad is your breathing?" (e.g., mild, moderate, severe)    - MILD: No SOB at rest, mild SOB with walking, speaks normally in sentences, can lay down, no retractions, pulse < 100.    - MODERATE: SOB at rest, SOB with minimal exertion and prefers to sit, cannot lie down flat, speaks in phrases, mild retractions, audible wheezing, pulse 100-120.    - SEVERE: Very SOB at rest, speaks in single words, struggling to breathe, sitting hunched forward, retractions, pulse > 120      Moderate 5. RECURRENT SYMPTOM: "Have you had difficulty breathing before?" If so, ask: "When was the last time?" and "What happened that time?"      Yes when had pneumonia last year about the same time 6. CARDIAC HISTORY: "Do you have any history of heart disease?" (e.g., heart attack, angina, bypass surgery, angioplasty)   No 7. LUNG HISTORY: "Do you have any history of lung disease?"  (e.g., pulmonary embolus, asthma, emphysema)     Asthma 8. CAUSE: "What do you think is causing the breathing problem?"      Not sure 9. OTHER SYMPTOMS: "Do you have any other symptoms? (e.g., dizziness, runny nose, cough, chest pain, fever)     Fever, cough, sinus congestion, chest pain when coughing 10. PREGNANCY: "Is there any chance you are pregnant?" "When was your last menstrual period?"       No 11. TRAVEL: "Have you traveled out of the country in the last month?" (e.g., travel history, exposures)       No  Protocols used: BREATHING DIFFICULTY-A-AH

## 2018-10-26 IMAGING — CR DG CHEST 2V
2 series · 2 of 2 positions shown · non-contrast
Comparison: 01/14/2017 chest radiograph.

CLINICAL DATA: Cough and fever

EXAM:
CHEST  2 VIEW

[w chest pa *]
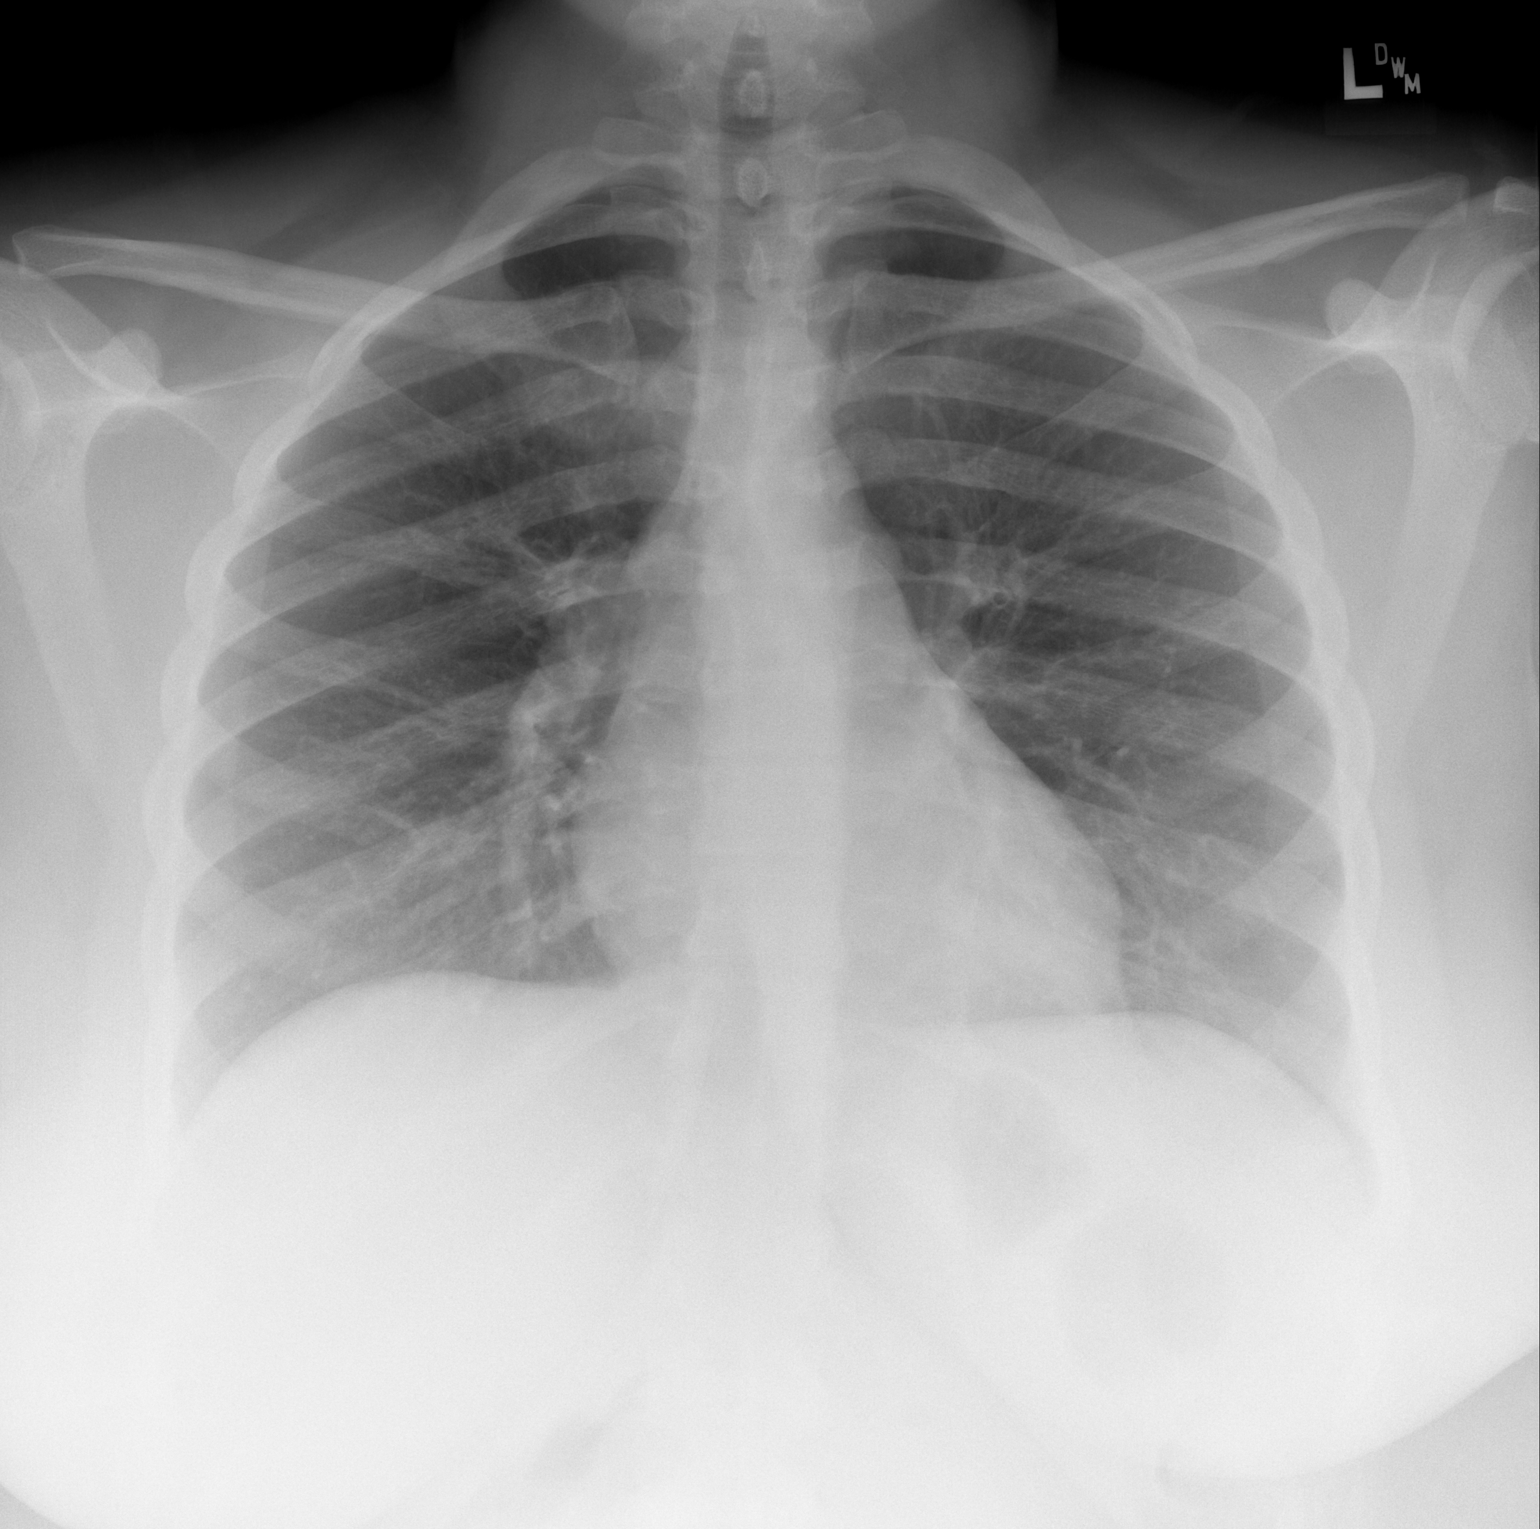

[w chest lat *]
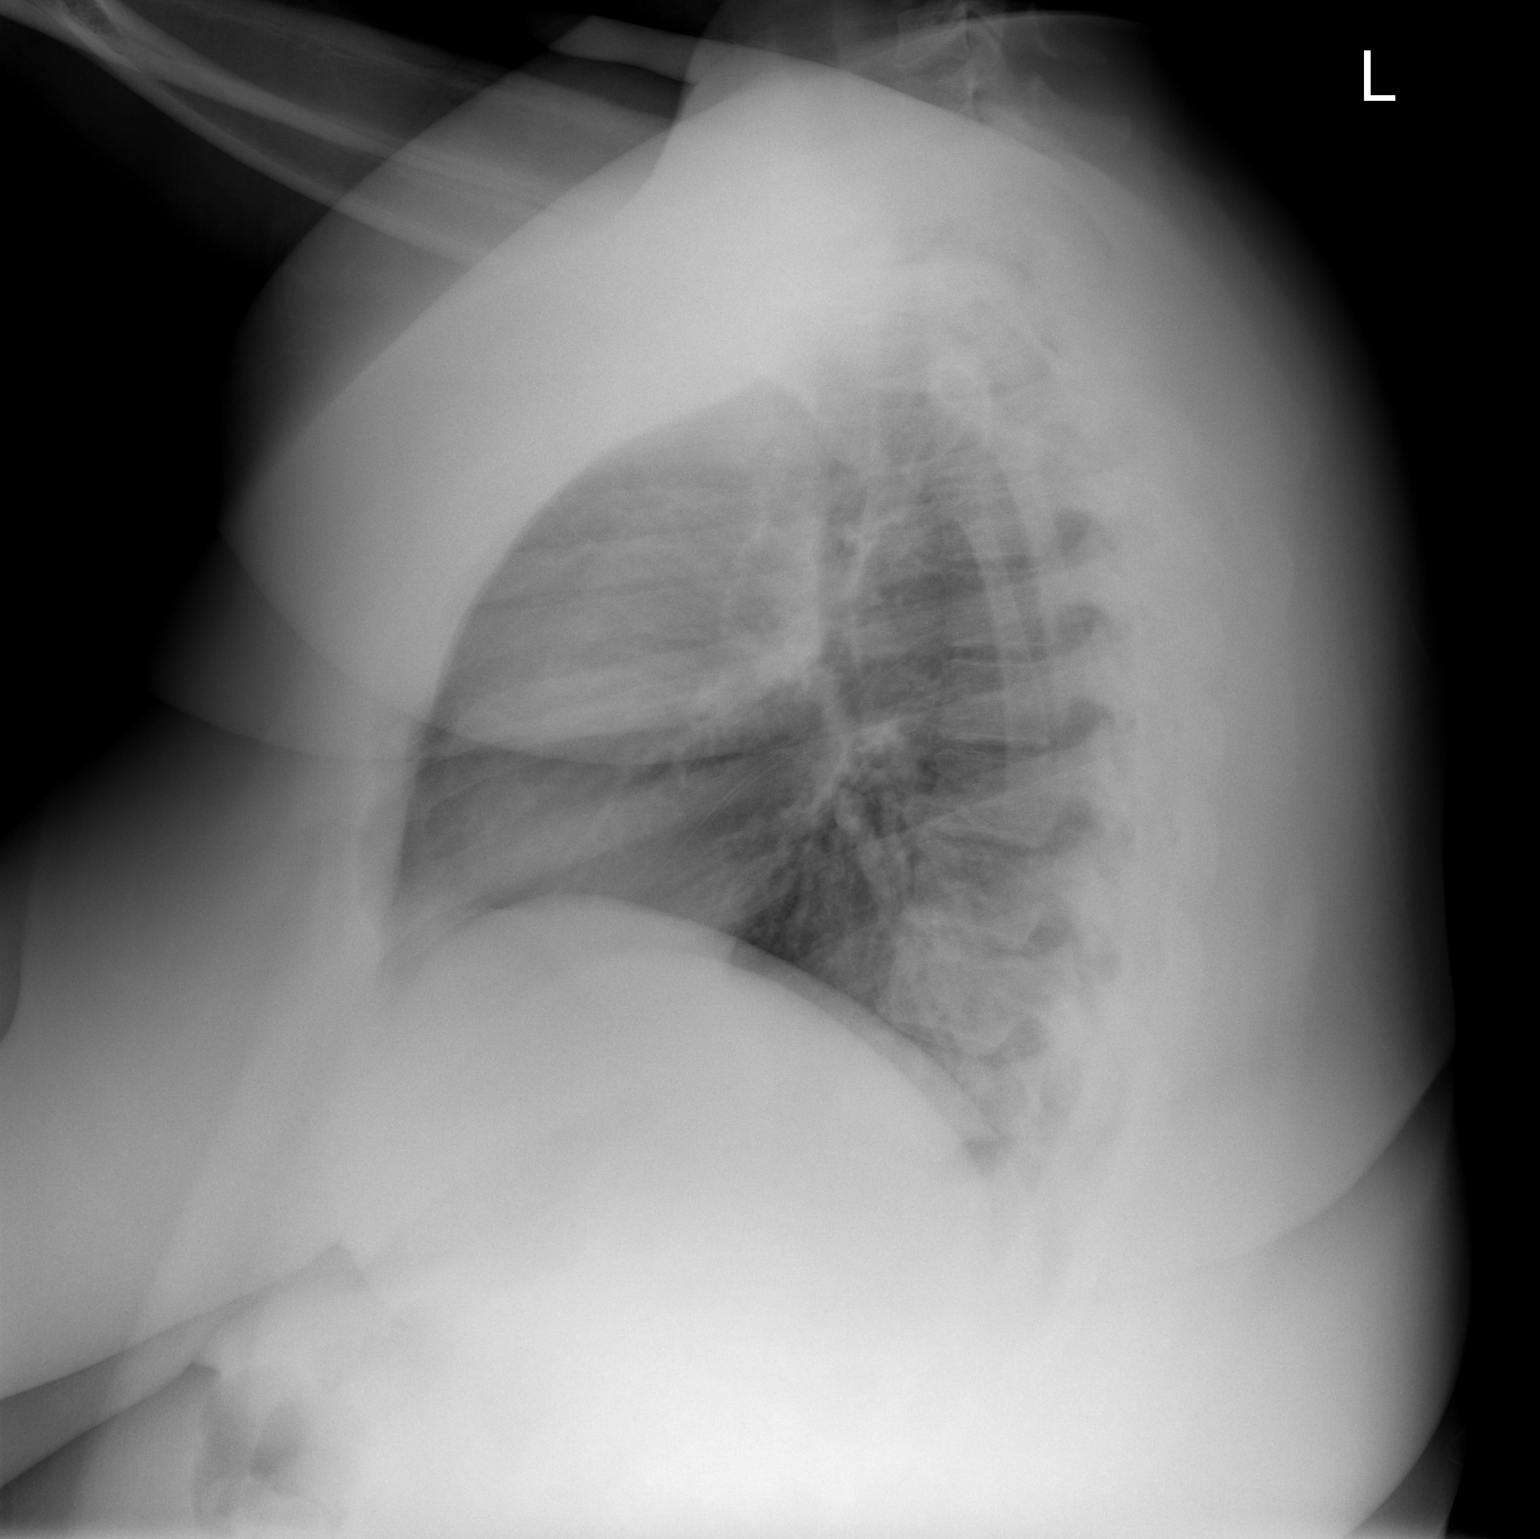

[2 of 2 positions shown; findings below may reference images not displayed]

FINDINGS: Stable cardiomediastinal silhouette with normal heart size. No
pneumothorax. No pleural effusion. Patchy left lower lobe opacity.
Clear right lung. No pulmonary edema.
IMPRESSION: Left lower lobe pneumonia. Recommend follow-up PA and lateral post
treatment chest radiographs in 4-6 weeks.

These results will be called to the ordering clinician or
representative by the [HOSPITAL] at the imaging location.

## 2021-05-16 ENCOUNTER — Ambulatory Visit (INDEPENDENT_AMBULATORY_CARE_PROVIDER_SITE_OTHER): Payer: Commercial Managed Care - POS | Admitting: Family

## 2021-05-16 ENCOUNTER — Encounter (INDEPENDENT_AMBULATORY_CARE_PROVIDER_SITE_OTHER): Payer: Self-pay

## 2021-05-16 VITALS — BP 132/91 | HR 94 | Temp 97.8°F

## 2021-05-16 DIAGNOSIS — J00 Acute nasopharyngitis [common cold]: Secondary | ICD-10-CM

## 2021-05-16 DIAGNOSIS — H65192 Other acute nonsuppurative otitis media, left ear: Secondary | ICD-10-CM

## 2021-05-16 LAB — POCT INFLUENZA A/B
POCT Rapid Influenza A AG: NEGATIVE
POCT Rapid Influenza B AG: NEGATIVE

## 2021-05-16 LAB — IN OFFICE COVID POCT ABBOTT ID NOW: SARS CoV 2 Overall Result: NEGATIVE

## 2021-05-16 MED ORDER — AZITHROMYCIN 250 MG PO TABS
ORAL_TABLET | ORAL | 0 refills | Status: AC
Start: 2021-05-16 — End: 2021-05-21

## 2021-05-16 NOTE — Progress Notes (Signed)
Monique Warner     Patient: Monique Warner   Date: 05/16/2021   MRN: [id number]     Subjective     Monique Warner is a 30 y.o. female    Chief Complaint   Patient presents with    Covid-19 Screening     Pt has been c/o sore throat, headaches, nasal congestion for 3 days.        HPI Monique Warner presents with possible COVID-19 exposure/infection.     Onset of symptoms: 3 days     Dry or Productive cough: No  Fever: No  Sob/dyspnea: No  Fatigue: No  Sore throat: Yes  Congestion/ Rhinorrhea:  Yes  Headache:  No  GI symptoms:  No  + left ear pain   History:    Pertinent Past Medical, Surgical, Family and Social History were reviewed.      No current outpatient medications on file.    Allergies   Allergen Reactions    Amoxicillin        Medications and Allergies reviewed.    Physical Exam    PHYSICAL EXAM:   GEN: Appears well, in no apparent distress, speaking in full sentences  HEENT: left TM injected/bulging. Tonsils- erythematous. No tonsillar swelling/ exudate  NECK: Supple, no significant adenopathy  LUNGS: Clear to auscultation, no wheezes, rales or rhonchi  HEART: Regular rate and rhythm, no murmurs noted    Labs/Studies:    LABS  The following POCT tests were ordered, reviewed and discussed with the patient/family.     Results       Procedure Component Value Units Date/Time    Influenza A/B [[id number]]  (Normal) Collected: 05/16/21 1717     Updated: 05/16/21 1717     POCT QC Pass     POCT Rapid Influenza A AG Negative     POCT Rapid Influenza B AG Negative    [facility] ID Now SARS-COV-2 POCT [[id number]]  (Normal) Collected: 05/16/21 1717    Specimen: Nasal Swab COVID-19 Updated: 05/16/21 1717     SARS CoV 2 Overall Result Negative            There were no x-rays reviewed with this patient during the visit.    Procedures    MDM:   History, physical and labs/studies most consistent with viral respiratory infection.    Differential Diagnosis: Influenza, Viral Syndrome, Pharyngitis, Upper Respiratory  Infection, Sinusitis, Pharyngitis, Allergic Rhinitis and Pneumonia         Assessment     Alaila was seen today for covid-19 screening.    Diagnoses and all orders for this visit:    Flu-like symptoms  -     Abbott ID Now SARS-COV-2 POCT  -     Influenza A/B          PLAN:    VSS  See tests results.   L AOM -   Azitrhromycin rx   Treatment as per orders. Push fluids. Use OTC acetaminophen for fever control.    Detailed instructions provided in AVS.  Return to clinic prn if these symptoms worsen or fail to improve as anticipated. ER precautions given.
